# Patient Record
Sex: Female | Born: 1975 | Race: White | Hispanic: No | Marital: Married | Smoking: Never smoker
Health system: Southern US, Community
[De-identification: ages and names within clinical notes are randomized; demographics above are authoritative.]

## PROBLEM LIST (undated history)

## (undated) DIAGNOSIS — E119 Type 2 diabetes mellitus without complications: Secondary | ICD-10-CM

## (undated) HISTORY — PX: CARPAL TUNNEL RELEASE: SHX101

## (undated) HISTORY — PX: COLPOSCOPY: SHX161

## (undated) HISTORY — PX: LYMPH NODE DISSECTION: SHX5087

## (undated) HISTORY — PX: ENDOMETRIAL ABLATION: SHX621

---

## 1997-11-21 ENCOUNTER — Other Ambulatory Visit: Admission: RE | Admit: 1997-11-21 | Discharge: 1997-11-21 | Payer: Self-pay | Admitting: *Deleted

## 1997-12-13 ENCOUNTER — Encounter: Admission: RE | Admit: 1997-12-13 | Discharge: 1998-03-13 | Payer: Self-pay | Admitting: *Deleted

## 1998-01-20 ENCOUNTER — Ambulatory Visit (HOSPITAL_COMMUNITY): Admission: RE | Admit: 1998-01-20 | Discharge: 1998-01-20 | Payer: Self-pay | Admitting: Internal Medicine

## 1998-04-15 ENCOUNTER — Encounter: Admission: RE | Admit: 1998-04-15 | Discharge: 1998-07-14 | Payer: Self-pay | Admitting: *Deleted

## 1998-10-13 ENCOUNTER — Inpatient Hospital Stay (HOSPITAL_COMMUNITY): Admission: AD | Admit: 1998-10-13 | Discharge: 1998-10-13 | Payer: Self-pay | Admitting: Obstetrics and Gynecology

## 1998-10-22 ENCOUNTER — Inpatient Hospital Stay (HOSPITAL_COMMUNITY): Admission: AD | Admit: 1998-10-22 | Discharge: 1998-10-22 | Payer: Self-pay | Admitting: *Deleted

## 1998-10-30 ENCOUNTER — Inpatient Hospital Stay (HOSPITAL_COMMUNITY): Admission: AD | Admit: 1998-10-30 | Discharge: 1998-10-30 | Payer: Self-pay | Admitting: *Deleted

## 1998-11-12 ENCOUNTER — Inpatient Hospital Stay (HOSPITAL_COMMUNITY): Admission: AD | Admit: 1998-11-12 | Discharge: 1998-11-15 | Payer: Self-pay | Admitting: Obstetrics and Gynecology

## 1998-11-18 ENCOUNTER — Encounter (HOSPITAL_COMMUNITY): Admission: RE | Admit: 1998-11-18 | Discharge: 1999-02-16 | Payer: Self-pay | Admitting: Obstetrics and Gynecology

## 1998-12-15 ENCOUNTER — Other Ambulatory Visit: Admission: RE | Admit: 1998-12-15 | Discharge: 1998-12-15 | Payer: Self-pay | Admitting: Obstetrics and Gynecology

## 2000-02-10 ENCOUNTER — Other Ambulatory Visit: Admission: RE | Admit: 2000-02-10 | Discharge: 2000-02-10 | Payer: Self-pay | Admitting: Obstetrics and Gynecology

## 2000-10-21 ENCOUNTER — Encounter (INDEPENDENT_AMBULATORY_CARE_PROVIDER_SITE_OTHER): Payer: Self-pay

## 2000-10-21 ENCOUNTER — Ambulatory Visit (HOSPITAL_COMMUNITY): Admission: RE | Admit: 2000-10-21 | Discharge: 2000-10-21 | Payer: Self-pay | Admitting: Obstetrics and Gynecology

## 2002-03-01 ENCOUNTER — Other Ambulatory Visit: Admission: RE | Admit: 2002-03-01 | Discharge: 2002-03-01 | Payer: Self-pay | Admitting: Obstetrics and Gynecology

## 2003-03-14 ENCOUNTER — Other Ambulatory Visit: Admission: RE | Admit: 2003-03-14 | Discharge: 2003-03-14 | Payer: Self-pay | Admitting: Obstetrics and Gynecology

## 2007-05-22 ENCOUNTER — Emergency Department (HOSPITAL_COMMUNITY): Admission: EM | Admit: 2007-05-22 | Discharge: 2007-05-22 | Payer: Self-pay | Admitting: Family Medicine

## 2010-10-08 ENCOUNTER — Other Ambulatory Visit: Payer: Self-pay | Admitting: Obstetrics and Gynecology

## 2010-10-08 ENCOUNTER — Encounter (HOSPITAL_COMMUNITY): Payer: BC Managed Care – PPO

## 2010-10-08 LAB — CBC
HCT: 41.6 % (ref 36.0–46.0)
MCH: 30.8 pg (ref 26.0–34.0)
MCHC: 34.6 g/dL (ref 30.0–36.0)
MCV: 88.9 fL (ref 78.0–100.0)
Platelets: 180 10*3/uL (ref 150–400)
RDW: 11.7 % (ref 11.5–15.5)
WBC: 6.1 10*3/uL (ref 4.0–10.5)

## 2010-10-08 LAB — BASIC METABOLIC PANEL
BUN: 15 mg/dL (ref 6–23)
Calcium: 9.3 mg/dL (ref 8.4–10.5)
Creatinine, Ser: 0.6 mg/dL (ref 0.4–1.2)
GFR calc non Af Amer: >60 mL/min (ref >60)
Glucose, Bld: 271 mg/dL — ABNORMAL HIGH (ref 70–99)

## 2010-10-16 ENCOUNTER — Other Ambulatory Visit: Payer: Self-pay | Admitting: Obstetrics and Gynecology

## 2010-10-16 ENCOUNTER — Ambulatory Visit (HOSPITAL_COMMUNITY)
Admission: RE | Admit: 2010-10-16 | Discharge: 2010-10-16 | Disposition: A | Discharge status: Home / Self Care | Payer: BC Managed Care – PPO | Source: Ambulatory Visit | Attending: Obstetrics and Gynecology | Admitting: Obstetrics and Gynecology

## 2010-10-16 DIAGNOSIS — Z01812 Encounter for preprocedural laboratory examination: Secondary | ICD-10-CM | POA: Insufficient documentation

## 2010-10-16 DIAGNOSIS — Z01818 Encounter for other preprocedural examination: Secondary | ICD-10-CM | POA: Insufficient documentation

## 2010-10-16 DIAGNOSIS — E119 Type 2 diabetes mellitus without complications: Secondary | ICD-10-CM | POA: Insufficient documentation

## 2010-10-16 DIAGNOSIS — N920 Excessive and frequent menstruation with regular cycle: Secondary | ICD-10-CM | POA: Insufficient documentation

## 2010-10-16 LAB — HCG, SERUM, QUALITATIVE: Preg, Serum: NEGATIVE

## 2010-10-19 NOTE — H&P (Signed)
  NAMELOREN, VICENS            ACCOUNT NO.:  [id number]  MEDICAL RECORD NO.:  [id number]           PATIENT TYPE:  O  LOCATION:  [facility]                          [facility]  [facility]  PHYSICIAN:  Lenoard Aden, M.D.DATE OF BIRTH:  Jun 02, 1976  DATE OF ADMISSION:  10/16/2010 DATE OF DISCHARGE:                             HISTORY & PHYSICAL   CHIEF COMPLAINT:  Menorrhagia, refractory.  HISTORY OF PRESENT ILLNESS:  A 35 year old white female G2, P1, with a history of menorrhagia, who presents for NovaSure endometrial ablation.  ALLERGIES:  She has allergies to LATEX and SUN.  MEDICATIONS:  Insulin.  SOCIAL HISTORY:  She is a nonsmoker, nondrinker.  She denies domestic or physical violence.  CONTRACEPTION:  His husband has a vasectomy.  She has a history of C-section in 2000 and miscarriage x1.  Her obstetric history otherwise noncontributory.  She had D and C for missed AB in 2002.  She had other surgical history include bunionectomy, wisdom tooth removal, and lymph node biopsy.  MEDICAL PROBLEMS:  Include, poorly controlled diabetes mellitus for which she has been seen this week by Endocrinology for preoperative clearance.  FAMILY HISTORY:  Remarkable for breast cancer, hypertension, mental retardation, and diabetes.  PHYSICAL EXAM:  GENERAL:  She is a well-developed, well-nourished white female, in no acute distress. HEENT:  Normal. NECK:  Supple.  Full range of motion. LUNGS:  Clear. HEART:  Regular rhythm. ABDOMEN:  Soft, nontender. PELVIC:  Reveals an anteflexed uterus and no adnexal masses.  IMPRESSION:  Menorrhagia, refractory for surgical therapy.  Plan is to proceed with diagnostic hysteroscopy, D and C, NovaSure.  Risks of anesthesia, infection, bleeding, injury to intraabdominal organs and need for repair was discussed.  Delayed versus immediate complications to include bowel and bladder injury noted.  The patient acknowledges and wishes to  proceed.     Lenoard Aden, M.D.     RJT/MEDQ  D:  10/16/2010  T:  10/16/2010  Job:  [id number]  Electronically Signed by Olivia Mackie M.D. on 10/19/2010 10:20:03 PM

## 2010-10-19 NOTE — Op Note (Signed)
  Sonya Mason, Sonya Mason            ACCOUNT NO.:  [id number]  MEDICAL RECORD NO.:  [id number]           PATIENT TYPE:  O  LOCATION:  [facility]                          [facility]  PHYSICIAN:  Lenoard Aden, M.D.DATE OF BIRTH:  [birth year]  DATE OF PROCEDURE: DATE OF DISCHARGE:                              OPERATIVE REPORT   PREOPERATIVE DIAGNOSIS:  Refractory menorrhagia.  POSTOPERATIVE DIAGNOSIS:  Refractory menorrhagia.  PROCEDURE:  Diagnostic hysteroscopy, D and C, NovaSure, endometrial ablation.  SURGEON:  Lenoard Aden, MD.  ASSISTANT:  None.  ANESTHESIA:  General and local.  ESTIMATED BLOOD LOSS:  Less than 50 mL.  FLUID DEFICIT:  Less than 50 mL.  COMPLICATIONS:  None.  DRAINS:  None.  COUNTS:  Correct.  The patient to recovery in good condition.  BRIEF OPERATIVE NOTE:  After being apprised of risks of anesthesia, infection, bleeding, injury to abdominal organs, need for repair, delayed versus complications to include bowel and bladder injury, possible need for repair, the patient was brought to the operating room, where she was administered general anesthetic without complications. Prepped and draped in sterile fashion.  Catheterized until the bladder was empty.  Achieving adequate anesthesia.  Dilute Marcaine solution placed.  Standard paracervical block was 20 mL total.  Cervix was easily dilated up to #25 [facility] dilator.  Hysteroscope placed.  Visualization reveals a thickened anterior and posterior endometrial wall, but an otherwise normal endometrial cavity with bilateral normal tubal ostia. At this time, endometrial curettings are collected using curettage and a 4 sharp curettage in a four quadrant method.  Good hemostasis was noted. Hysteroscope having been previously removed.  NovaSure device was placed.  Measurements were taken, cavity length of 6.5, cavity width was seated to 4.7.  The device was set.  CO2 test was performed and is negative.   Procedure was initiated for 1 minute and 10 seconds to a power of 168 watts.  Device was then removed at the termination of procedure, inspected and found to be without defects.  Revisualization of the endometrial cavity reveals no evidence of uterine perforation, a well-ablated endometrial cavity except for a small strip of endometrium at the uterine fundus.  At this time, the procedure was terminated.  Good hemostasis was noted.  All instruments were removed.  The patient tolerated the procedure and was transferred to recovery in good condition.     Lenoard Aden, M.D.     RJT/MEDQ  D:  10/16/2010  T:  10/17/2010  Job:  [id number]  Electronically Signed by Olivia Mackie M.D. on 10/19/2010 10:20:06 PM

## 2010-10-23 NOTE — H&P (Signed)
[facility]  Patient:    Sonya Mason, Sonya Mason                          MRN: [id number] Adm. Date:  10/21/00 Attending:  Lenoard Aden, M.D. CC:         [facility]   History and Physical  CHIEF COMPLAINT:              Missed AB.  HISTORY OF PRESENT ILLNESS:   The patient is a 35 year old white female, G1, P1, with known insulin-dependent diabetes and hypertension, who presents with missed AB at 6 weeks, with no fetal heart tones seen on ultrasound, after previously noted fetal bradycardia one week ago.  PAST MEDICAL HISTORY:         1. Chronic hypertension.                               2. Insulin-dependent diabetes.                               3. Hypothyroidism.  MEDICATIONS:                  Insulin.  Prenatal vitamins.  Synthroid.  PAST SURGICAL HISTORY:        1. Lymph node biopsy in 1995.                               2. Wisdom teeth removal in 1997.                               3. Bunionectomy in 2000.                               4. C-section in 2000.  FAMILY HISTORY:               Diabetes.  Hypertension.  Heart disease.  LABORATORY DATA:              Blood type B positive.  PHYSICAL EXAMINATION:  GENERAL:                      Well-developed, well-nourished white female in no apparent distress.  HEENT:                        Normal.  LUNGS:                        Clear.  HEART:                        Regular rhythm.  ABDOMEN:                      Soft, obese, nontender.  PELVIC:                       Small anteflexed uterus, and no adnexal masses.  IMPRESSION:                   1. Missed AB at 6 weeks.  2. Insulin-dependent diabetes, with poor                                  control.                               3. Hypertension, well controlled on no                                  medications.  PLAN:                         Proceed with suction D&E.  The risks of anesthesia, infection,  bleeding, injury to abdominal organs with need for repair was discussed.  Delayed versus immediate complications to include bowel or bladder injury were noted.  Diabetes protocol ordered. DD:  10/21/00 TD:  10/21/00 Job: [id number]

## 2010-10-23 NOTE — Op Note (Signed)
[facility]  Patient:    Sonya Mason, Sonya Mason                        MRN: [id number] Proc. Date: 10/21/00 Adm. Date:  [id number] Disc. Date: [id number] Attending:  Lenoard Aden                           Operative Report  PREOPERATIVE DIAGNOSIS:       Missed abortion at 6-1/2 weeks.  POSTOPERATIVE DIAGNOSIS:      Missed abortion at 6-1/2 weeks.  PROCEDURE:                    Suction dilation and evacuation.  SURGEON:                      Lenoard Aden, M.D.  ANESTHESIA:                   MAC, paracervical.  ANESTHESIOLOGIST:             Gretta Cool., M.D.  COMPLICATIONS:                None.  COUNTS:                       Correct.  SPECIMEN:                     Products of conception to pathology.  ESTIMATED BLOOD LOSS:         50 cc.  DISPOSITION:                  The patient to recovery room in good condition.  DESCRIPTION OF PROCEDURE:     After being apprised of the risks of anesthesia, infection, bleeding, uterine perforation, possible need for repair to bowel, bladder with injury, the patient is brought to the operating room after consents are signed. After achieving adequate IV anesthesia, paracervical block placed with dilute Xylocaine solution, 20 cc, after she is prepped and draped in the usual sterile fashion. At this time, the uterus sounds to 8 cm, it is an anteflexed uterus with no adnexal masses noted on the exam under anesthesia. The uterus is easily dilated up to a #21 [employer] dilator. A 7 mm suction curet placed. Suction obtaining obvious pregnancy related tissue is aspirated using suctioning. Blunt curettage in four-quadrant method reveals the cavity to be empty. Repeat suction curettage confirms. Good hemostasis noted. All instruments are removed under direct visualization. The patient is awakened and transferred to recovery room in good condition. DD:  10/21/00 TD:  10/23/00 Job: [id number]

## 2011-03-31 ENCOUNTER — Emergency Department: Payer: Self-pay | Admitting: Emergency Medicine

## 2012-09-25 ENCOUNTER — Other Ambulatory Visit: Payer: Self-pay | Admitting: Endocrinology

## 2012-09-25 DIAGNOSIS — E049 Nontoxic goiter, unspecified: Secondary | ICD-10-CM

## 2012-10-02 ENCOUNTER — Other Ambulatory Visit: Payer: BC Managed Care – PPO

## 2012-10-18 ENCOUNTER — Encounter: Payer: Self-pay | Admitting: *Deleted

## 2012-10-18 ENCOUNTER — Encounter: Payer: BC Managed Care – PPO | Attending: Endocrinology | Admitting: *Deleted

## 2012-10-18 DIAGNOSIS — Z713 Dietary counseling and surveillance: Secondary | ICD-10-CM | POA: Insufficient documentation

## 2012-10-18 DIAGNOSIS — Z9641 Presence of insulin pump (external) (internal): Secondary | ICD-10-CM | POA: Insufficient documentation

## 2012-10-18 DIAGNOSIS — E109 Type 1 diabetes mellitus without complications: Secondary | ICD-10-CM | POA: Insufficient documentation

## 2012-10-18 NOTE — Patient Instructions (Signed)
Plan:  Aim for 3 Carb Choices per meal (45 grams) +/- 1 either way  Aim for 0-2 Carbs per snack if hungry  Consider reading food labels for Total Carbohydrate of foods Continue with your current activity level daily as tolerated Consider checking BG pre meals each day as tolerated We will ask Dr. Talmage Nap about you wearing the iPro sensor to evaluate pump settings better

## 2012-10-18 NOTE — Progress Notes (Signed)
  Medical Nutrition Therapy:  Appt start time: 1500 end time:  1630.   Assessment:  Primary concerns today: she has had Type 1 diabetes for 18 years and needs further diabetes and pump education as well as better Carb Counting skills. She lives with her husband and 36 yo son. Patient does the grocery shopping, very little food prep at home. [place] with [place], horses, cows and pigs. Works for a Administrator, [employer] in [place]. She is going to school at [employer] studying animal classes so can apply for [employer]. Works outside all day. SMBG once or twice daily, uses Medtronic pump but with limited skills or use of tools that could improve her contol. Her major barrier to her diabetes is the variability of BG ranging from 40 to over 400 mg/dl in a day.  MEDICATIONS: see list, Novolog in insulin pump with average of about 30 units/day.   DIETARY INTAKE: Usual eating pattern includes 2 meals and 3 snacks per day.  Everyday foods include fair variety of all food groups.  Avoided foods include regular soda, coffee, salty foods .    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  Estimated energy needs: 1400 calories 158 g carbohydrates 105 g protein 39 g fat  Progress Towards Goal(s):  In progress.   Nutritional Diagnosis:  NB-1.1 Food and nutrition-related knowledge deficit As related to diabetes control.  As evidenced by A1c of 10.4% in April, 2014.    Intervention:  Nutrition counseling and diabetes education initiated. Discussed basic physiology of diabetes, SMBG and rationale of checking BG at alternate times of day, A1c, Carb  Counting and reading food labels. I uploaded her pump to [facility] and we reviewed the reports together. Spent most of visit teaching Carb Counting via food groups and how to read food labels. Suggest she aim for more consistency of carb intake and once we can stabilize her BGs, we can then move them down gradually. Also suggested use of iPro for up to a week to capture more data of BG patterns so we can adjust her pump settings more accurately. I will call MD office to request referral for her to wear iPRo.  Plan:  Aim for 3 Carb Choices per meal (45 grams) +/- 1 either way  Aim for 0-2 Carbs per snack if hungry  Consider reading food labels for Total Carbohydrate of foods Continue with your current activity level daily as tolerated Consider checking BG pre meals each day as tolerated We will ask Dr. Talmage Nap about you wearing the iPro sensor to evaluate pump settings better   Handouts given during visit include: Living Well with Diabetes Carb Counting and Food Label handouts Meal Plan Card  [facility] Pro Reports printed and reviewed  Monitoring/Evaluation:  Dietary intake, exercise, SMBG before each meal, and body weight in 2 week(s).

## 2012-10-18 NOTE — Progress Notes (Signed)
Also noted that patient is not changing pump out every 3 days but more like every 5-6. Sonya Mason is Suspending the pump for several hours at a time due to physical activity and is not aware of the Temp Basal tool.  Recommended that Sonya Mason change out every 3 days and we will cover the Temp Basal at the next visitl

## 2012-11-01 ENCOUNTER — Encounter: Payer: BC Managed Care – PPO | Admitting: *Deleted

## 2012-11-02 NOTE — Progress Notes (Signed)
iPro Set Up  Time arrived:1500  Time left: 1530  Patient here for placement of iPro2 Continuous Glucose Monitor  Explained to patient purpose of wearing the iPro per MD orders. They expressed verbal understanding.  Sensor inserted into skin at least 2 inches from any insulin injection sites. Patient instructed to check BG at least 4 times each day. Explained to patient how to complete the iPro2 Log Sheet including time of all BG, food intake, exercise and any diabetes medications.  Patient instructed to return on 11/06/2012 for removal of sensor and iPro2 along with the completed Log Sheet  iPro2 Recorder attached to sensor and taped down.  Patient informed that when iPro2 and sensor are connected, the system is waterproof and that they are to wear it consistently until they return to this office.  Patient to call this office if any questions or concerns prior to appointment to return the iPro2 for downloading.

## 2012-11-26 ENCOUNTER — Emergency Department (HOSPITAL_COMMUNITY)
Admission: EM | Admit: 2012-11-26 | Discharge: 2012-11-27 | Disposition: A | Discharge status: Home / Self Care | Payer: BC Managed Care – PPO | Attending: Emergency Medicine | Admitting: Emergency Medicine

## 2012-11-26 ENCOUNTER — Encounter (HOSPITAL_COMMUNITY): Payer: Self-pay | Admitting: *Deleted

## 2012-11-26 DIAGNOSIS — E119 Type 2 diabetes mellitus without complications: Secondary | ICD-10-CM | POA: Insufficient documentation

## 2012-11-26 DIAGNOSIS — Z79899 Other long term (current) drug therapy: Secondary | ICD-10-CM | POA: Insufficient documentation

## 2012-11-26 DIAGNOSIS — Z794 Long term (current) use of insulin: Secondary | ICD-10-CM | POA: Insufficient documentation

## 2012-11-26 DIAGNOSIS — Z3202 Encounter for pregnancy test, result negative: Secondary | ICD-10-CM | POA: Insufficient documentation

## 2012-11-26 DIAGNOSIS — N390 Urinary tract infection, site not specified: Secondary | ICD-10-CM

## 2012-11-26 DIAGNOSIS — Z880 Allergy status to penicillin: Secondary | ICD-10-CM | POA: Insufficient documentation

## 2012-11-26 DIAGNOSIS — R112 Nausea with vomiting, unspecified: Secondary | ICD-10-CM | POA: Insufficient documentation

## 2012-11-26 DIAGNOSIS — R000 Tachycardia, unspecified: Secondary | ICD-10-CM | POA: Insufficient documentation

## 2012-11-26 HISTORY — DX: Type 2 diabetes mellitus without complications: E11.9

## 2012-11-26 LAB — COMPREHENSIVE METABOLIC PANEL
BUN: 15 mg/dL (ref 6–23)
CO2: 24 mEq/L (ref 19–32)
Calcium: 9.2 mg/dL (ref 8.4–10.5)
Creatinine, Ser: 0.65 mg/dL (ref 0.50–1.10)
GFR calc Af Amer: >90 mL/min (ref >90)
GFR calc non Af Amer: >90 mL/min (ref >90)
Glucose, Bld: 259 mg/dL — ABNORMAL HIGH (ref 70–99)

## 2012-11-26 LAB — CBC WITH DIFFERENTIAL/PLATELET
Eosinophils Relative: 0 % (ref 0–5)
HCT: 40.7 % (ref 36.0–46.0)
Lymphocytes Relative: 5 % — ABNORMAL LOW (ref 12–46)
Lymphs Abs: 1 10*3/uL (ref 0.7–4.0)
MCV: 87.2 fL (ref 78.0–100.0)
Monocytes Absolute: 1.5 10*3/uL — ABNORMAL HIGH (ref 0.1–1.0)
Monocytes Relative: 8 % (ref 3–12)
RBC: 4.67 MIL/uL (ref 3.87–5.11)
RDW: 11.6 % (ref 11.5–15.5)
WBC: 18 10*3/uL — ABNORMAL HIGH (ref 4.0–10.5)

## 2012-11-26 LAB — URINALYSIS, ROUTINE W REFLEX MICROSCOPIC
Bilirubin Urine: NEGATIVE
Nitrite: POSITIVE — AB
Protein, ur: 30 mg/dL — AB
Specific Gravity, Urine: 1.031 — ABNORMAL HIGH (ref 1.005–1.030)
Urobilinogen, UA: 0.2 mg/dL (ref 0.0–1.0)

## 2012-11-26 LAB — GLUCOSE, CAPILLARY: Glucose-Capillary: 223 mg/dL — ABNORMAL HIGH (ref 70–99)

## 2012-11-26 LAB — URINE MICROSCOPIC-ADD ON

## 2012-11-26 MED ORDER — ONDANSETRON HCL 4 MG/2ML IJ SOLN
4.0000 mg | Freq: Once | INTRAMUSCULAR | Status: AC
  Administered 2012-11-26: 4 mg via INTRAVENOUS
  Filled 2012-11-26: qty 2

## 2012-11-26 MED ORDER — SULFAMETHOXAZOLE-TMP DS 800-160 MG PO TABS
1.0000 | ORAL_TABLET | Freq: Once | ORAL | Status: AC
  Administered 2012-11-26: 1 via ORAL
  Filled 2012-11-26: qty 1

## 2012-11-26 MED ORDER — SODIUM CHLORIDE 0.9 % IV SOLN: 1000.0000 mL | Freq: Once | INTRAVENOUS | Status: DC

## 2012-11-26 MED ORDER — METOCLOPRAMIDE HCL 5 MG/ML IJ SOLN
10.0000 mg | Freq: Once | INTRAMUSCULAR | Status: AC
  Administered 2012-11-26: 10 mg via INTRAVENOUS
  Filled 2012-11-26: qty 2

## 2012-11-26 MED ORDER — SODIUM CHLORIDE 0.9 % IV BOLUS (SEPSIS)
1000.0000 mL | Freq: Once | INTRAVENOUS | Status: AC
  Administered 2012-11-26: 1000 mL via INTRAVENOUS

## 2012-11-26 MED ORDER — ONDANSETRON HCL 4 MG/2ML IJ SOLN
4.0000 mg | Freq: Once | INTRAMUSCULAR | Status: DC
  Filled 2012-11-26: qty 2

## 2012-11-26 NOTE — ED Notes (Signed)
Pt states that she began having abdominal pain and nausea at 200 today; pt states that co-workers had a "stomach bug" last week; pt states that she has vomited multiple times and is now having dry heaves; pt states that the pain is to upper mid abd and describes it as soreness; pt denies blood to vomit.

## 2012-11-26 NOTE — ED Provider Notes (Signed)
History     CSN: [id number]  Arrival date & time 11/26/12  4696   First MD Initiated Contact with Patient 11/26/12 2053      Chief Complaint  Patient presents with  . Abdominal Pain    (Consider location/radiation/quality/duration/timing/severity/associated sxs/prior treatment) HPI Comments: Patient is an insulin-dependent diabetic, on an insulin pump by Dr. Anthonette Legato this, afternoon.  She started having some nausea and vomiting, and developed epigastric discomfort, denies any diarrhea.  States her blood sugar has been slightly elevated in the low 200s.  Denies any shortness of breath, coughing, chest pain, headache, URI or UTI symptoms  Patient is a 37 y.o. female presenting with abdominal pain. The history is provided by the patient.  Abdominal Pain This is a new problem. The problem occurs constantly. The problem has been unchanged. Associated symptoms include abdominal pain, nausea and vomiting. Pertinent negatives include no weakness. Nothing aggravates the symptoms. She has tried nothing for the symptoms. The treatment provided no relief.    Past Medical History  Diagnosis Date  . Diabetes mellitus without complication     History reviewed. No pertinent past surgical history.  No family history on file.  History  Substance Use Topics  . Smoking status: Never Smoker   . Smokeless tobacco: Never Used  . Alcohol Use: No    OB History   Grav Para Term Preterm Abortions TAB SAB Ect Mult Living                  Review of Systems  Gastrointestinal: Positive for nausea, vomiting and abdominal pain. Negative for diarrhea and constipation.  Genitourinary: Negative for dysuria.  Neurological: Negative for weakness.  All other systems reviewed and are negative.    Allergies  Penicillins  Home Medications   Current Outpatient Rx  Name  Route  Sig  Dispense  Refill  . phentermine 37.5 MG capsule   Oral   Take 37.5 mg by mouth every morning.         . insulin  aspart (NOVOLOG) 100 UNIT/ML injection   Subcutaneous   Inject 30 Units into the skin daily.         . ondansetron (ZOFRAN) 4 MG tablet   Oral   Take 1 tablet (4 mg total) by mouth every 6 (six) hours.   12 tablet   0   . sulfamethoxazole-trimethoprim (BACTRIM DS) 800-160 MG per tablet   Oral   Take 1 tablet by mouth 2 (two) times daily.   5 tablet   0     BP 116/70  Pulse 121  Temp(Src) 99.5 F (37.5 C) (Oral)  Resp 18  Wt 185 lb (83.915 kg)  BMI 29.87 kg/m2  SpO2 100%  Physical Exam  Nursing note and vitals reviewed. Constitutional: She is oriented to person, place, and time. She appears well-developed and well-nourished.  Neck: Normal range of motion.  Cardiovascular: Tachycardia present.   Pulmonary/Chest: Effort normal and breath sounds normal.  Abdominal: Soft. Bowel sounds are normal. She exhibits no distension. There is tenderness.  Musculoskeletal: Normal range of motion.  Neurological: She is alert and oriented to person, place, and time.  Skin: Skin is warm. No erythema.    ED Course  Procedures (including critical care time)  Labs Reviewed  CBC WITH DIFFERENTIAL - Abnormal; Notable for the following:    WBC 18.0 (*)    Neutrophils Relative % 86 (*)    Neutro Abs 15.6 (*)    Lymphocytes Relative 5 (*)  Monocytes Absolute 1.5 (*)    All other components within normal limits  COMPREHENSIVE METABOLIC PANEL - Abnormal; Notable for the following:    Sodium 134 (*)    Glucose, Bld 259 (*)    All other components within normal limits  URINALYSIS, ROUTINE W REFLEX MICROSCOPIC - Abnormal; Notable for the following:    Color, Urine AMBER (*)    APPearance CLOUDY (*)    Specific Gravity, Urine 1.031 (*)    Glucose, UA 500 (*)    Hgb urine dipstick TRACE (*)    Ketones, ur >80 (*)    Protein, ur 30 (*)    Nitrite POSITIVE (*)    Leukocytes, UA SMALL (*)    All other components within normal limits  GLUCOSE, CAPILLARY - Abnormal; Notable for the  following:    Glucose-Capillary 223 (*)    All other components within normal limits  URINE MICROSCOPIC-ADD ON - Abnormal; Notable for the following:    Squamous Epithelial / LPF MANY (*)    Bacteria, UA MANY (*)    All other components within normal limits  URINE CULTURE  POCT PREGNANCY, URINE   No results found.   1. UTI (lower urinary tract infection)       MDM   Patient has UTI.  Will recheck blood sugar.  Patient is able to tolerate fluids        Arman Filter, NP 11/27/12 [phone]

## 2012-11-26 NOTE — ED Notes (Signed)
Pt vomiting x 5 hrs, unable to keep PO down. Pt is diabetic and has insulin pump.

## 2012-11-27 LAB — GLUCOSE, CAPILLARY: Glucose-Capillary: 242 mg/dL — ABNORMAL HIGH (ref 70–99)

## 2012-11-27 MED ORDER — SULFAMETHOXAZOLE-TMP DS 800-160 MG PO TABS: 1.0000 | ORAL_TABLET | Freq: Two times a day (BID) | ORAL | Status: DC

## 2012-11-27 MED ORDER — ONDANSETRON HCL 4 MG PO TABS: 4.0000 mg | ORAL_TABLET | Freq: Four times a day (QID) | ORAL | Status: DC

## 2012-11-27 NOTE — ED Provider Notes (Signed)
Medical screening examination/treatment/procedure(s) were performed by non-physician practitioner and as supervising physician I was immediately available for consultation/collaboration.   Mikesha Migliaccio J. Vale Peraza, MD 11/27/12 1509 

## 2012-11-29 LAB — URINE CULTURE

## 2012-11-30 NOTE — ED Notes (Signed)
Post ED Visit - Positive Culture Follow-up  Culture report reviewed by antimicrobial stewardship pharmacist: []  Wes Dulaney, Pharm.D., BCPS [x]  Celedonio Miyamoto, Pharm.D., BCPS []  Georgina Pillion, Pharm.D., BCPS []  [place], [place].D., BCPS, AAHIVP []  Estella Husk, Pharm.D., BCPS, AAHIVP  Positive urine culture Treated with Sulfa-DS, organism sensitive to the same and no further patient follow-up is required at this time.  Larena Sox 11/30/2012, 2:52 PM

## 2014-02-21 ENCOUNTER — Ambulatory Visit: Payer: Self-pay | Admitting: Podiatry

## 2014-02-28 ENCOUNTER — Ambulatory Visit: Payer: Self-pay | Admitting: Podiatry

## 2014-09-26 ENCOUNTER — Encounter: Payer: Self-pay | Admitting: Podiatry

## 2014-09-26 ENCOUNTER — Ambulatory Visit (INDEPENDENT_AMBULATORY_CARE_PROVIDER_SITE_OTHER): Payer: BLUE CROSS/BLUE SHIELD | Admitting: Podiatry

## 2014-09-26 VITALS — Ht 66.0 in | Wt 190.0 lb

## 2014-09-26 DIAGNOSIS — B351 Tinea unguium: Secondary | ICD-10-CM

## 2014-09-26 NOTE — Patient Instructions (Signed)
Diabetes and Foot Care Diabetes may cause you to have problems because of poor blood supply (circulation) to your feet and legs. This may cause the skin on your feet to become thinner, break easier, and heal more slowly. Your skin may become dry, and the skin may peel and crack. You may also have nerve damage in your legs and feet causing decreased feeling in them. You may not notice minor injuries to your feet that could lead to infections or more serious problems. Taking care of your feet is one of the most important things you can do for yourself.  HOME CARE INSTRUCTIONS  Wear shoes at all times, even in the house. Do not go barefoot. Bare feet are easily injured.  Check your feet daily for blisters, cuts, and redness. If you cannot see the bottom of your feet, use a mirror or ask someone for help.  Wash your feet with warm water (do not use hot water) and mild soap. Then pat your feet and the areas between your toes until they are completely dry. Do not soak your feet as this can dry your skin.  Apply a moisturizing lotion or petroleum jelly (that does not contain alcohol and is unscented) to the skin on your feet and to dry, brittle toenails. Do not apply lotion between your toes.  Trim your toenails straight across. Do not dig under them or around the cuticle. File the edges of your nails with an emery board or nail file.  Do not cut corns or calluses or try to remove them with medicine.  Wear clean socks or stockings every day. Make sure they are not too tight. Do not wear knee-high stockings since they may decrease blood flow to your legs.  Wear shoes that fit properly and have enough cushioning. To break in new shoes, wear them for just a few hours a day. This prevents you from injuring your feet. Always look in your shoes before you put them on to be sure there are no objects inside.  Do not cross your legs. This may decrease the blood flow to your feet.  If you find a minor scrape,  cut, or break in the skin on your feet, keep it and the skin around it clean and dry. These areas may be cleansed with mild soap and water. Do not cleanse the area with peroxide, alcohol, or iodine.  When you remove an adhesive bandage, be sure not to damage the skin around it.  If you have a wound, look at it several times a day to make sure it is healing.  Do not use heating pads or hot water bottles. They may burn your skin. If you have lost feeling in your feet or legs, you may not know it is happening until it is too late.  Make sure your health care provider performs a complete foot exam at least annually or more often if you have foot problems. Report any cuts, sores, or bruises to your health care provider immediately. SEEK MEDICAL CARE IF:   You have an injury that is not healing.  You have cuts or breaks in the skin.  You have an ingrown nail.  You notice redness on your legs or feet.  You feel burning or tingling in your legs or feet.  You have pain or cramps in your legs and feet.  Your legs or feet are numb.  Your feet always feel cold. SEEK IMMEDIATE MEDICAL CARE IF:   There is increasing redness,   swelling, or pain in or around a wound.  There is a red line that goes up your leg.  Pus is coming from a wound.  You develop a fever or as directed by your health care provider.  You notice a bad smell coming from an ulcer or wound. Document Released: 05/21/2000 Document Revised: 01/24/2013 Document Reviewed: 10/31/2012 Exit[employer] Patient Information 2015 [employer]. This information is not intended to replace advice given to you by your health care provider. Make sure you discuss any questions you have with your health care provider.  

## 2014-09-26 NOTE — Progress Notes (Signed)
   Subjective:    Patient ID: Sonya Mason, female    DOB: 1975-08-30, 39 y.o.   MRN: [id number]  HPI 39 year old female presents the office they with complaints of left hallux discoloration and concern for possible nail fungus. She states the nail previously fell off great in one year ago however when it grew back its was discolored and thickened. She is diabetic and states her last blood sugar check today was 260. Her A1c is unknown. Denies any history of ulceration. Denies any numbness or tingling. Denies any claudication symptoms.   Review of Systems  All other systems reviewed and are negative.      Objective:   Physical Exam AAO 3, NAD DP/PT pulses palpable, CRT less than 3 seconds  Protective sensation intact with Simms Weinstein monofilament, vibratory sensation intact, Achilles tendon reflex intact. Left hallux nails hypertrophic, dystrophic, discolored, brittle. There is no tenderness to palpation around the nail mostly swelling erythema or drainage. There is no tenderness  to the other nails. No areas of tenderness to bilateral lower extremities. No overlying edema, erythema, increase in warmth bilaterally. MMT 5/5, ROM WNL No open lesions or pre-ulcerative lesions identified bilaterally    Assessment & Plan:  39 year old female left hallux onychodystrophy, likely onychomycosis -Treatment options were discussed with the patient include alternatives, risks, complications -At this time the nail was biopsied and sent to [facility] labs for evaluation of possible onychomycosis. Discussed various treatment options however we will await the results of the biopsy before proceeding with treatment. Follow-up after nail biopsy results are obtained or sooner if any problems are to arise. In the meantime call any questions or concerns.

## 2014-11-26 ENCOUNTER — Telehealth: Payer: Self-pay | Admitting: *Deleted

## 2014-11-26 NOTE — Telephone Encounter (Signed)
Patient is positive for fungus needs a follow up appointment with dr Jacqualyn Posey for treatment. Called and left voicemail on mobile phone

## 2014-11-28 ENCOUNTER — Telehealth: Payer: Self-pay | Admitting: Podiatry

## 2014-11-28 NOTE — Telephone Encounter (Signed)
I called the patient at her request to discuss fungal culture results. No answer. Left voicemail to call the office to discuss.

## 2014-11-29 ENCOUNTER — Telehealth: Payer: Self-pay | Admitting: *Deleted

## 2014-11-29 MED ORDER — TAVABOROLE 5 % EX SOLN: 1.0000 [drp] | Freq: Every day | CUTANEOUS | Status: DC

## 2014-11-29 NOTE — Telephone Encounter (Signed)
Entered in error

## 2014-11-29 NOTE — Telephone Encounter (Addendum)
Informed pt Dr. Jacqualyn Posey reviewed fungal culture results as positive and order Lamisil or topical therapy.  Pt states she would prefer topical, but if not covered by insurance would use the Lamisil.  Dr. Jacqualyn Posey ordered [place] Mon, and pt is informed the rx will be coming from [facility].

## 2015-01-06 ENCOUNTER — Encounter: Payer: Self-pay | Admitting: Podiatry

## 2015-01-06 ENCOUNTER — Ambulatory Visit (INDEPENDENT_AMBULATORY_CARE_PROVIDER_SITE_OTHER): Payer: BLUE CROSS/BLUE SHIELD

## 2015-01-06 ENCOUNTER — Ambulatory Visit (INDEPENDENT_AMBULATORY_CARE_PROVIDER_SITE_OTHER): Payer: BLUE CROSS/BLUE SHIELD | Admitting: Podiatry

## 2015-01-06 VITALS — BP 157/96 | HR 96 | Resp 12

## 2015-01-06 DIAGNOSIS — M722 Plantar fascial fibromatosis: Secondary | ICD-10-CM | POA: Diagnosis not present

## 2015-01-06 MED ORDER — TRIAMCINOLONE ACETONIDE 10 MG/ML IJ SUSP
10.0000 mg | Freq: Once | INTRAMUSCULAR | Status: AC
  Administered 2015-01-06: 10 mg

## 2015-01-06 MED ORDER — MELOXICAM 7.5 MG PO TABS: 7.5000 mg | ORAL_TABLET | Freq: Every day | ORAL | Status: DC

## 2015-01-06 NOTE — Patient Instructions (Signed)
Plantar Fasciitis (Heel Spur Syndrome) with Rehab The plantar fascia is a fibrous, ligament-like, soft-tissue structure that spans the bottom of the foot. Plantar fasciitis is a condition that causes pain in the foot due to inflammation of the tissue. SYMPTOMS   Pain and tenderness on the underneath side of the foot.  Pain that worsens with standing or walking. CAUSES  Plantar fasciitis is caused by irritation and injury to the plantar fascia on the underneath side of the foot. Common mechanisms of injury include:  Direct trauma to bottom of the foot.  Damage to a small nerve that runs under the foot where the main fascia attaches to the heel bone.  Stress placed on the plantar fascia due to bone spurs. RISK INCREASES WITH:   Activities that place stress on the plantar fascia (running, jumping, pivoting, or cutting).  Poor strength and flexibility.  Improperly fitted shoes.  Tight calf muscles.  Flat feet.  Failure to warm-up properly before activity.  Obesity. PREVENTION  Warm up and stretch properly before activity.  Allow for adequate recovery between workouts.  Maintain physical fitness:  Strength, flexibility, and endurance.  Cardiovascular fitness.  Maintain a health body weight.  Avoid stress on the plantar fascia.  Wear properly fitted shoes, including arch supports for individuals who have flat feet. PROGNOSIS  If treated properly, then the symptoms of plantar fasciitis usually resolve without surgery. However, occasionally surgery is necessary. RELATED COMPLICATIONS   Recurrent symptoms that may result in a chronic condition.  Problems of the lower back that are caused by compensating for the injury, such as limping.  Pain or weakness of the foot during push-off following surgery.  Chronic inflammation, scarring, and partial or complete fascia tear, occurring more often from repeated injections. TREATMENT  Treatment initially involves the use of  ice and medication to help reduce pain and inflammation. The use of strengthening and stretching exercises may help reduce pain with activity, especially stretches of the Achilles tendon. These exercises may be performed at home or with a therapist. Your caregiver may recommend that you use heel cups of arch supports to help reduce stress on the plantar fascia. Occasionally, corticosteroid injections are given to reduce inflammation. If symptoms persist for greater than 6 months despite non-surgical (conservative), then surgery may be recommended.  MEDICATION   If pain medication is necessary, then nonsteroidal anti-inflammatory medications, such as aspirin and ibuprofen, or other minor pain relievers, such as acetaminophen, are often recommended.  Do not take pain medication within 7 days before surgery.  Prescription pain relievers may be given if deemed necessary by your caregiver. Use only as directed and only as much as you need.  Corticosteroid injections may be given by your caregiver. These injections should be reserved for the most serious cases, because they may only be given a certain number of times. HEAT AND COLD  Cold treatment (icing) relieves pain and reduces inflammation. Cold treatment should be applied for 10 to 15 minutes every 2 to 3 hours for inflammation and pain and immediately after any activity that aggravates your symptoms. Use ice packs or massage the area with a piece of ice (ice massage).  Heat treatment may be used prior to performing the stretching and strengthening activities prescribed by your caregiver, physical therapist, or athletic trainer. Use a heat pack or soak the injury in warm water. SEEK IMMEDIATE MEDICAL CARE IF:  Treatment seems to offer no benefit, or the condition worsens.  Any medications produce adverse side effects. EXERCISES RANGE   OF MOTION (ROM) AND STRETCHING EXERCISES - Plantar Fasciitis (Heel Spur Syndrome) These exercises may help you  when beginning to rehabilitate your injury. Your symptoms may resolve with or without further involvement from your physician, physical therapist or athletic trainer. While completing these exercises, remember:   Restoring tissue flexibility helps normal motion to return to the joints. This allows healthier, less painful movement and activity.  An effective stretch should be held for at least 30 seconds.  A stretch should never be painful. You should only feel a gentle lengthening or release in the stretched tissue. RANGE OF MOTION - Toe Extension, Flexion  Sit with your right / left leg crossed over your opposite knee.  Grasp your toes and gently pull them back toward the top of your foot. You should feel a stretch on the bottom of your toes and/or foot.  Hold this stretch for __________ seconds.  Now, gently pull your toes toward the bottom of your foot. You should feel a stretch on the top of your toes and or foot.  Hold this stretch for __________ seconds. Repeat __________ times. Complete this stretch __________ times per day.  RANGE OF MOTION - Ankle Dorsiflexion, Active Assisted  Remove shoes and sit on a chair that is preferably not on a carpeted surface.  Place right / left foot under knee. Extend your opposite leg for support.  Keeping your heel down, slide your right / left foot back toward the chair until you feel a stretch at your ankle or calf. If you do not feel a stretch, slide your bottom forward to the edge of the chair, while still keeping your heel down.  Hold this stretch for __________ seconds. Repeat __________ times. Complete this stretch __________ times per day.  STRETCH - Gastroc, Standing  Place hands on wall.  Extend right / left leg, keeping the front knee somewhat bent.  Slightly point your toes inward on your back foot.  Keeping your right / left heel on the floor and your knee straight, shift your weight toward the wall, not allowing your back to  arch.  You should feel a gentle stretch in the right / left calf. Hold this position for __________ seconds. Repeat __________ times. Complete this stretch __________ times per day. STRETCH - Soleus, Standing  Place hands on wall.  Extend right / left leg, keeping the other knee somewhat bent.  Slightly point your toes inward on your back foot.  Keep your right / left heel on the floor, bend your back knee, and slightly shift your weight over the back leg so that you feel a gentle stretch deep in your back calf.  Hold this position for __________ seconds. Repeat __________ times. Complete this stretch __________ times per day. STRETCH - Gastrocsoleus, Standing  Note: This exercise can place a lot of stress on your foot and ankle. Please complete this exercise only if specifically instructed by your caregiver.   Place the ball of your right / left foot on a step, keeping your other foot firmly on the same step.  Hold on to the wall or a rail for balance.  Slowly lift your other foot, allowing your body weight to press your heel down over the edge of the step.  You should feel a stretch in your right / left calf.  Hold this position for __________ seconds.  Repeat this exercise with a slight bend in your right / left knee. Repeat __________ times. Complete this stretch __________ times per day.    STRENGTHENING EXERCISES - Plantar Fasciitis (Heel Spur Syndrome)  These exercises may help you when beginning to rehabilitate your injury. They may resolve your symptoms with or without further involvement from your physician, physical therapist or athletic trainer. While completing these exercises, remember:   Muscles can gain both the endurance and the strength needed for everyday activities through controlled exercises.  Complete these exercises as instructed by your physician, physical therapist or athletic trainer. Progress the resistance and repetitions only as guided. STRENGTH -  Towel Curls  Sit in a chair positioned on a non-carpeted surface.  Place your foot on a towel, keeping your heel on the floor.  Pull the towel toward your heel by only curling your toes. Keep your heel on the floor.  If instructed by your physician, physical therapist or athletic trainer, add ____________________ at the end of the towel. Repeat __________ times. Complete this exercise __________ times per day. STRENGTH - Ankle Inversion  Secure one end of a rubber exercise band/tubing to a fixed object (table, pole). Loop the other end around your foot just before your toes.  Place your fists between your knees. This will focus your strengthening at your ankle.  Slowly, pull your big toe up and in, making sure the band/tubing is positioned to resist the entire motion.  Hold this position for __________ seconds.  Have your muscles resist the band/tubing as it slowly pulls your foot back to the starting position. Repeat __________ times. Complete this exercises __________ times per day.  Document Released: 05/24/2005 Document Revised: 08/16/2011 Document Reviewed: 09/05/2008 [employer] Patient Information 2015 [employer]. This information is not intended to replace advice given to you by your health care provider. Make sure you discuss any questions you have with your health care provider.  

## 2015-01-06 NOTE — Progress Notes (Signed)
   Subjective:    Patient ID: Sonya Mason, female    DOB: 10-30-1975, 40 y.o.   MRN: [id number]  HPI  39 year old female presents the office today with concerns of right heel pain which started last Wednesday. She states that she has pain in the morning or after periods of rest which is relieved by activity. She denies any history of injury or trauma. Denies any shoe changes. She denies any numbness or tingling and denies any swelling or redness. The pain does not wake her up at night. She's had no prior treatment. No other complaints at this time. She is continuing with Kerydin for nail fungus without any problems.   Review of Systems  Musculoskeletal: Positive for gait problem.  All other systems reviewed and are negative.      Objective:   Physical Exam AAO x3, NAD DP/PT pulses palpable bilaterally, CRT less than 3 seconds Protective sensation intact with Simms Weinstein monofilament, vibratory sensation intact, Achilles tendon reflex intact Tenderness to palpation overlying the plantar medial tubercle of the calcaneus to right heel at the insertion of the plantar fascia. There is no pain along the course of plantar fascial within the arch of the foot. There is no pain with lateral compression of the calcaneus or pain the vibratory sensation. No pain on the posterior aspect of the calcaneus or along the course/insertion of the Achilles tendon. There is no overlying edema, erythema, increase in warmth. No other areas of tenderness palpation or pain with vibratory sensation to the foot/ankle. MMT 5/5, ROM WNL No open lesions or pre-ulcerative lesions are identified. No pain with calf compression, swelling, warmth, erythema.     Assessment & Plan:  39 year old female right heel pain, likely plantar fasciitis -X-rays were obtained and reviewed with the patient.  -Treatment options discussed including all alternatives, risks, and complications Patient elects to proceed with steroid  injection into the right heel. Under sterile skin preparation, a total of 2.5cc of kenalog 10, 0.5% Marcaine plain, and 2% lidocaine plain were infiltrated into the symptomatic area without complication. A band-aid was applied. Patient tolerated the injection well without complication. Post-injection care with discussed with the patient. Discussed with the patient to ice the area over the next couple of days to help prevent a steroid flare.  -Dispensed plantar fascial brace -Continue ice and stretching activities on a daily basis consistently. -Prescribed mobic. Discussed side effects of the medication and directed to stop if any are to occur and call the office.  -Discussed shoe gear modifications and orthotics. -Follow-up 3 weeks or sooner if any problems arise. In the meantime, encouraged to call the office with any questions, concerns, change in symptoms.   Celesta Gentile, DPM

## 2015-01-21 ENCOUNTER — Ambulatory Visit (INDEPENDENT_AMBULATORY_CARE_PROVIDER_SITE_OTHER): Payer: BLUE CROSS/BLUE SHIELD | Admitting: Podiatry

## 2015-01-21 ENCOUNTER — Encounter: Payer: Self-pay | Admitting: Podiatry

## 2015-01-21 VITALS — BP 153/94 | HR 82 | Resp 18

## 2015-01-21 DIAGNOSIS — M722 Plantar fascial fibromatosis: Secondary | ICD-10-CM | POA: Diagnosis not present

## 2015-01-21 MED ORDER — MELOXICAM 7.5 MG PO TABS: 7.5000 mg | ORAL_TABLET | Freq: Every day | ORAL | Status: DC

## 2015-01-22 NOTE — Progress Notes (Signed)
Patient ID: Sonya Mason, female   DOB: Feb 08, 1976, 39 y.o.   MRN: [id number]  Subjective: 39 year old female presents the office today for follow-up evaluation of right heel pain and plantar fasciitis. She states that she had some relief after the injection for approximate 5 days for the pain started to recur. She does stretch and ice intermittently although not on a consecutive basis. She also states that she was wearing the plantar fascial brace which helps some. She did not at the meloxicam prescription. She continues to wear sandals. She states that she became frustrated but she still has pain and would like to treat this before he gets to much worse as multiple Mel Almond #recently undergone surgery for the same problem. No other complaints at this time. No acute changes.  Objective: AAO x3, NAD DP/PT pulses palpable bilaterally, CRT less than 3 seconds Protective sensation intact with Simms Weinstein monofilament, vibratory sensation intact, Achilles tendon reflex intact There is continued tenderness to palpation overlying the plantar medial tubercle of the calcaneus to the right heel at the insertion of the plantar fascia. There is no pain along the course of plantar fascia within the arch of the foot. There is no pain with lateral compression of the calcaneus or pain the vibratory sensation. No pain on the posterior aspect of the calcaneus or along the course/insertion of the Achilles tendon. There is no overlying edema, erythema, increase in warmth. No other areas of tenderness palpation or pain with vibratory sensation to the foot/ankle. MMT 5/5, ROM WNL. Equinus present. Upon dorsiflexion of the ankle the Achilles tendon feels tight and she does have some discomfort to the area. There is no defect noted. No open lesions or pre-ulcerative lesions are identified. No pain with calf compression, swelling, warmth, erythema.  Assessment: 39 year old female continued right heel pain, plantar  fasciitis  Plan: -Treatment options discussed including all alternatives, risks, and complications -She wishes to hold off on another steroid injection. -I reordered meloxicam as she states that she did not get the prescription last time. -Continue stretching icing activities daily. -I do believe that she would likely benefit from orthotics. Discussed with custom and over-the-counter. Shoulder purchasing over-the-counter. -Dispensed night splint. -She gear changes. Discussed with her to wear more supportive shoe at all times. Recommended not to go barefoot unit while at home. -Follow-up 3-4 weeks or sooner if any problems arise. In the meantime, encouraged to call the office with any questions, concerns, change in symptoms.   Celesta Gentile, DPM

## 2015-02-11 ENCOUNTER — Telehealth: Payer: Self-pay | Admitting: *Deleted

## 2015-02-11 NOTE — Telephone Encounter (Signed)
Called patient on 02/04/15 and left a message to call us back and that her insurance denied the kerydin and per Dr Jacqualyn Posey patient could try lamisil for 30 days and get bloodwork done. Patient has not called back today but does have an appt on 02-20-15. Lattie Haw

## 2015-02-18 ENCOUNTER — Ambulatory Visit: Payer: BLUE CROSS/BLUE SHIELD | Admitting: Podiatry

## 2015-02-20 ENCOUNTER — Ambulatory Visit: Payer: BLUE CROSS/BLUE SHIELD | Admitting: Podiatry

## 2015-03-13 ENCOUNTER — Encounter: Payer: Self-pay | Admitting: Podiatry

## 2015-03-13 ENCOUNTER — Ambulatory Visit (INDEPENDENT_AMBULATORY_CARE_PROVIDER_SITE_OTHER): Payer: BLUE CROSS/BLUE SHIELD | Admitting: Podiatry

## 2015-03-13 VITALS — BP 116/77 | HR 86 | Resp 18

## 2015-03-13 DIAGNOSIS — M779 Enthesopathy, unspecified: Secondary | ICD-10-CM | POA: Diagnosis not present

## 2015-03-13 NOTE — Patient Instructions (Signed)
Peroneal Tendinitis With Rehab Tendonitis is inflammation of a tendon. Inflammation of the tendons on the back of the outer ankle (peroneal tendons) is known as peroneal tendonitis. The peroneal tendons are responsible for connecting the muscles that allow you to stand on your tiptoes to the bones of the ankle. For this reason, peroneal tendonitis often causes pain when trying to complete such motions. Peroneal tendonitis often involves a tear (strain) of the peroneal tendons. Strains are classified into three categories. Grade 1 strains cause pain, but the tendon is not lengthened. Grade 2 strains include a lengthened ligament, due to the ligament being stretched or partially ruptured. With grade 2 strains there is still function, although function may be decreased. Grade 3 strains involve a complete tear of the tendon or muscle, and function is usually impaired. SYMPTOMS   Pain, tenderness, swelling, warmth, or redness over the back of the outer side of the ankle, the outer part of the mid-foot, or the bottom of the arch.  Pain that gets worse with ankle motion (especially when pushing off or pushing down with the front of the foot), or when standing on the ball of the foot or pushing the foot outward.  Crackling sound (crepitation) when the tendon is moved or touched. CAUSES  Peroneal tendinitis occurs when injury to the peroneal tendons causes the body to respond with inflammation. Common causes of injury include:  An overuse injury, in which the groove behind the outer ankle (where the tendon is located) causes wear on the tendon.  A sudden stress placed on the tendon, such as from an increase in the intensity, frequency, or duration of training.  Direct hit (trauma) to the tendon.  Return to activity too soon after a previous ankle injury. RISK INCREASES WITH:  Sports that require sudden, repetitive pushing off of the foot, such as jumping or quick starts.  Kicking and running sports,  especially running down hills or long distances.  Poor strength and flexibility.  Previous injury to the foot, ankle, or leg. PREVENTION  Warm up and stretch properly before activity.  Allow for adequate recovery between workouts.  Maintain physical fitness:  Strength, flexibility, and endurance.  Cardiovascular fitness.  Complete rehabilitation after previous injury. PROGNOSIS  If treated properly, peroneal tendonitis usually heals within 6 weeks.  RELATED COMPLICATIONS  Longer healing time, if not properly treated or if not given enough time to heal.  Recurring symptoms if activity is resumed too soon, with overuse, or when using poor technique.  If untreated, tendinitis may result in tendon rupture, requiring surgery. TREATMENT  Treatment first involves the use of ice and medicine to reduce pain and inflammation. The use of strengthening and stretching exercises may help reduce pain with activity. These exercises may be performed at unsuccessful, surgery to remove the inflamed tendon lining (sheath) may be advised.  MEDICATION   If pain medicine is needed, nonsteroidal anti-inflammatory medicines (aspirin and ibuprofen), or other minor pain relievers (acetaminophen), are often advised.  Do not take pain medicine for 7 days before surgery.  Prescription pain relievers may be given, if your caregiver thinks they are needed. Use only as directed and only as much as you need. HEAT AND COLD  Cold treatment (icing) should be applied for 10 to 15 minutes every 2 to 3 hours for inflammation and pain, and immediately after activity that aggravates your symptoms. Use ice packs or an ice massage.  Heat treatment may be used before performing stretching and strengthening activities prescribed by your   caregiver, physical therapist, or athletic trainer. Use a heat pack or a warm water soak. SEEK MEDICAL CARE IF:  Symptoms get worse or do not improve in 2 to 4 weeks, despite  treatment.  New, unexplained symptoms develop. (Drugs used in treatment may produce side effects.) EXERCISES RANGE OF MOTION (ROM) AND STRETCHING EXERCISES - Peroneal Tendinitis These exercises may help you when beginning to rehabilitate your injury. Your symptoms may resolve with or without further involvement from your physician, physical therapist or athletic trainer. While completing these exercises, remember:   Restoring tissue flexibility helps normal motion to return to the joints. This allows healthier, less painful movement and activity.  An effective stretch should be held for at least 30 seconds.  A stretch should never be painful. You should only feel a gentle lengthening or release in the stretched tissue. RANGE OF MOTION - Ankle Eversion  Sit with your right / left ankle crossed over your opposite knee.  Grip your foot with your opposite hand, placing your thumb on the top of your foot and your fingers across the bottom of your foot.  Gently push your foot downward with a slight rotation, so your littlest toes rise slightly toward the ceiling.  You should feel a gentle stretch on the inside of your ankle. Hold the stretch for __________ seconds. Repeat __________ times. Complete this exercise __________ times per day.  RANGE OF MOTION - Ankle Inversion  Sit with your right / left ankle crossed over your opposite knee.  Grip your foot with your opposite hand, placing your thumb on the bottom of your foot and your fingers across the top of your foot.  Gently pull your foot so the smallest toe comes toward you and your thumb pushes the inside of the ball of your foot away from you.  You should feel a gentle stretch on the outside of your ankle. Hold the stretch for __________ seconds. Repeat __________ times. Complete this exercise __________ times per day.  RANGE OF MOTION - Ankle Plantar Flexion  Sit with your right / left leg crossed over your opposite knee.  Use  your opposite hand to pull the top of your foot and toes toward you.  You should feel a gentle stretch on the top of your foot and ankle. Hold this position for __________ seconds. Repeat __________ times. Complete __________ times per day.  STRETCH - Gastroc, Standing  Place your hands on a wall.  Extend your right / left leg behind you, keeping the front knee somewhat bent.  Slightly point your toes inward on your back foot.  Keeping your right / left heel on the floor and your knee straight, shift your weight toward the wall, not allowing your back to arch.  You should feel a gentle stretch in the calf. Hold this position for __________ seconds. Repeat __________ times. Complete this stretch __________ times per day. STRETCH - Soleus, Standing  Place your hands on a wall.  Extend your right / left leg behind you, keeping the other knee somewhat bent.  Slightly point your toes inward on your back foot.  Keep your heel on the floor, bend your back knee, and slightly shift your weight over the back leg so that you feel a gentle stretch deep in your back calf.  Hold this position for __________ seconds. Repeat __________ times. Complete this stretch __________ times per day. STRETCH - Gastrocsoleus, Standing Note: This exercise can place a lot of stress on your foot and ankle. Please   complete this exercise only if specifically instructed by your caregiver.   Place the ball of your right / left foot on a step, keeping your other foot firmly on the same step.  Hold on to the wall or a rail for balance.  Slowly lift your other foot, allowing your body weight to press your heel down over the edge of the step.  You should feel a stretch in your right / left calf.  Hold this position for __________ seconds.  Repeat this exercise with a slight bend in your knee. Repeat __________ times. Complete this stretch __________ times per day.  STRENGTHENING EXERCISES - Peroneal Tendinitis   These exercises may help you when beginning to rehabilitate your injury. They may resolve your symptoms with or without further involvement from your physician, physical therapist or athletic trainer. While completing these exercises, remember:   Muscles can gain both the endurance and the strength needed for everyday activities through controlled exercises.  Complete these exercises as instructed by your physician, physical therapist or athletic trainer. Increase the resistance and repetitions only as guided by your caregiver. STRENGTH - Dorsiflexors  Secure a rubber exercise band or tubing to a fixed object (table, pole) and loop the other end around your right / left foot.  Sit on the floor facing the fixed object. The band should be slightly tense when your foot is relaxed.  Slowly draw your foot back toward you, using your ankle and toes.  Hold this position for __________ seconds. Slowly release the tension in the band and return your foot to the starting position. Repeat __________ times. Complete this exercise __________ times per day.  STRENGTH - Towel Curls  Sit in a chair, on a non-carpeted surface.  Place your foot on a towel, keeping your heel on the floor.  Pull the towel toward your heel only by curling your toes. Keep your heel on the floor.  If instructed by your physician, physical therapist or athletic trainer, add weight to the end of the towel. Repeat __________ times. Complete this exercise __________ times per day. STRENGTH - Ankle Eversion   Secure one end of a rubber exercise band or tubing to a fixed object (table, pole). Loop the other end around your foot, just before your toes.  Place your fists between your knees. This will focus your strengthening at your ankle.  Drawing the band across your opposite foot, away from the pole, slowly, pull your little toe out and up. Make sure the band is positioned to resist the entire motion.  Hold this position for  __________ seconds.  Have your muscles resist the band, as it slowly pulls your foot back to the starting position. Repeat __________ times. Complete this exercise __________ times per day.    This information is not intended to replace advice given to you by your health care provider. Make sure you discuss any questions you have with your health care provider.   Document Released: 05/24/2005 Document Revised: 10/08/2014 Document Reviewed: 09/05/2008 Elsevier Interactive Patient Education 2016 [employer]  

## 2015-03-17 NOTE — Progress Notes (Signed)
Patient ID: Sonya Mason, female   DOB: 04/28/1976, 39 y.o.   MRN: [id number]  Subjective: 39 year old female presents the office today for follow-up evaluation of right heel pain. She states that the pain has moved this morning outside part of her ankle at this time. She says the pain to her heels actually resolved most of her pain is the outside part of her ankle. She cancer last appointment she is doing well however since then the pain started to recur. She denies any change or increase in activities she denies any recent injury or trauma. She denies any swelling or redness. No tingling or numbness. The pain does not wake her up at night. No other complaints at this time. Denies any systemic complaints as fevers, chills, nausea, vomiting. No calf pain, chest pain, shortness of breath.  Objective: AAO 3, NAD DP/PT pulses palpable, CRT less than 3 seconds Protective sensation intact with Simms Weinstein monofilament There is tenderness palpation along the right lateral ankle just inferior to lateral malleolus and just proximal to the fifth metatarsal base on the course of the peroneal tendon. There is no pain with eversion/inversion. No pain with range of motion of the peroneal tendons. There nail tendons appear to be intact. There is no specific area pinpoint bony tenderness or pain vibratory sensation. There is no pain on the plantar medial tubercle of the calcaneus or along the course of the plantar fascia/insertion. She does appear to walk on the lateral aspect of her foot during ambulation and rolls outward. There is no overlying edema, erythema, increase in warmth. There is no pain with calf compression, swelling, warmth, erythema.  Assessment: 39 year old female with peroneal tendinitis with resolving right heel pain  Plan: -Treatment options discussed including all alternatives, risks, and complications -Etiology of symptoms were discussed -Dispesned trilock brace  -She does not take  his inflammatory sign discussed full tear and gel. She states that she has similar medicine at home already that she went to try. -Range of motion/rehabilitation exercises. -Supportive shoe gear. Discussed orthotics. -Follow-up in 3-4 weeks if symptoms are not resolved or sooner if any problems arise. In the meantime, encouraged to call the office with any questions, concerns, change in symptoms.   Celesta Gentile, DPM

## 2015-04-10 ENCOUNTER — Ambulatory Visit: Payer: BLUE CROSS/BLUE SHIELD | Admitting: Podiatry

## 2015-05-07 ENCOUNTER — Inpatient Hospital Stay: Payer: BLUE CROSS/BLUE SHIELD

## 2015-05-14 ENCOUNTER — Encounter: Payer: Self-pay | Admitting: Internal Medicine

## 2015-05-14 ENCOUNTER — Inpatient Hospital Stay: Payer: BLUE CROSS/BLUE SHIELD | Attending: Internal Medicine | Admitting: Internal Medicine

## 2015-05-14 VITALS — BP 132/87 | HR 88 | Temp 97.6°F | Resp 18 | Ht 66.0 in | Wt 202.4 lb

## 2015-05-14 DIAGNOSIS — D6851 Activated protein C resistance: Secondary | ICD-10-CM | POA: Insufficient documentation

## 2015-05-14 DIAGNOSIS — E119 Type 2 diabetes mellitus without complications: Secondary | ICD-10-CM | POA: Insufficient documentation

## 2015-05-14 DIAGNOSIS — Z79899 Other long term (current) drug therapy: Secondary | ICD-10-CM | POA: Diagnosis not present

## 2015-05-14 DIAGNOSIS — Z794 Long term (current) use of insulin: Secondary | ICD-10-CM

## 2015-05-14 NOTE — Progress Notes (Signed)
Pt here as new pt with dx of factor v leiden mutation.  She has had carpal tunnel surgery, and as a teenager she had a lymph node that is the size of golf ball and they removed it and it was not cancer. She had not had any blood clots

## 2015-05-14 NOTE — Progress Notes (Signed)
[place] @ [facility] Telephone:(336) [phone]  Fax:([phone]) [place]: 1975/10/25  MR#: [id number]  YBO#:[id number]  Patient Care Team: Jacelyn Pi, MD as PCP - General (Endocrinology)  CHIEF COMPLAINT: No chief complaint on file.    No history exists.    Oncology Flowsheet 11/26/2012  metoCLOPramide (REGLAN) IV 10 mg  ondansetron (ZOFRAN) IV 4 mg    HISTORY OF PRESENT ILLNESS:   Mrs. 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 Her father develops pulmonary embolism one week after having a knee surgery, and subsequently he was tested positive for factor V Leiden mutation. Unfortunately, Mrs. Sonya Mason does not know whether he is heterozygote or homozygous for this mutation. As a follow-up to this development she was tested as well, and turned out to have 1 copy of mutated gene. She has no complains, her major medical problem is diabetes, for which she is receiving insulin via pump. She denies any history of blood clots, lower extremity swelling or pain, shortness of breath or cough. She was pregnant twice and did not have any blood clots during either of the pregnancies. She has never taken any oral contraceptive pills.   REVIEW OF SYSTEMS:   Review of Systems  All other systems reviewed and are negative.    PAST MEDICAL HISTORY: Past Medical History  Diagnosis Date  . Diabetes mellitus without complication ([place])     PAST SURGICAL HISTORY: Past Surgical History  Procedure Laterality Date  . Carpal tunnel release    . Lymph node dissection Right     no cancer. right neck    FAMILY HISTORY Family History  Problem Relation Age of Onset  . Hypertension Mother   . Factor V Leiden deficiency Father   . Deep vein thrombosis Father   . Bone cancer Paternal Aunt   . Breast cancer Paternal Grandmother   . Hypertension Paternal Grandfather   . Skin cancer  Paternal Aunt   . Thyroid disease Paternal Aunt     ADVANCED DIRECTIVES:  No flowsheet data found.  HEALTH MAINTENANCE: Social History  Substance Use Topics  . Smoking status: Never Smoker   . Smokeless tobacco: Never Used  . Alcohol Use: No     Allergies  Allergen Reactions  . Penicillins Rash    Current Outpatient Prescriptions  Medication Sig Dispense Refill  . insulin aspart (NOVOLOG) 100 UNIT/ML injection Inject 30 Units into the skin daily.    . meloxicam (MOBIC) 7.5 MG tablet Take 1 tablet (7.5 mg total) by mouth daily. 30 tablet 2  . Tavaborole (KERYDIN) 5 % SOLN Apply 1 drop topically daily. 1 Bottle 11   No current facility-administered medications for this visit.    OBJECTIVE:  Filed Vitals:   05/14/15 1107  BP: 132/87  Pulse: 88  Temp: 97.6 F (36.4 C)  Resp: 18     Body mass index is 32.68 kg/(m^2).    ECOG FS:0 - Asymptomatic  Physical Exam  Constitutional: She is well-developed, well-nourished, and in no distress.  Caucasian female  Eyes: Conjunctivae and EOM are normal. Pupils are equal, round, and reactive to light.  Neck: Normal range of motion. Neck supple. No tracheal deviation present.  Pulmonary/Chest: No stridor.  Musculoskeletal: Normal range of motion.  Skin: Skin is warm and dry. No rash noted. No erythema.     LAB RESULTS:  CBC Latest Ref Rng 11/26/2012 10/08/2010  WBC 4.0 -  10.5 K/uL 18.0(H) 6.1  Hemoglobin 12.0 - 15.0 g/dL 13.9 14.4  Hematocrit 36.0 - 46.0 % 40.7 41.6  Platelets 150 - 400 K/uL 204 180    No visits with results within 5 Day(s) from this visit. Latest known visit with results is:  Admission on 11/26/2012, Discharged on 11/27/2012  Component Date Value Ref Range Status  . WBC 11/26/2012 18.0* 4.0 - 10.5 K/uL Final  . RBC 11/26/2012 4.67  3.87 - 5.11 MIL/uL Final  . Hemoglobin 11/26/2012 13.9  12.0 - 15.0 g/dL Final  . HCT 11/26/2012 40.7  36.0 - 46.0 % Final  . MCV 11/26/2012 87.2  78.0 - 100.0 fL Final  .  MCH 11/26/2012 29.8  26.0 - 34.0 pg Final  . MCHC 11/26/2012 34.2  30.0 - 36.0 g/dL Final  . RDW 11/26/2012 11.6  11.5 - 15.5 % Final  . Platelets 11/26/2012 204  150 - 400 K/uL Final  . Neutrophils Relative % 11/26/2012 86* 43 - 77 % Final  . Neutro Abs 11/26/2012 15.6* 1.7 - 7.7 K/uL Final  . Lymphocytes Relative 11/26/2012 5* 12 - 46 % Final  . Lymphs Abs 11/26/2012 1.0  0.7 - 4.0 K/uL Final  . Monocytes Relative 11/26/2012 8  3 - 12 % Final  . Monocytes Absolute 11/26/2012 1.5* 0.1 - 1.0 K/uL Final  . Eosinophils Relative 11/26/2012 0  0 - 5 % Final  . Eosinophils Absolute 11/26/2012 0.0  0.0 - 0.7 K/uL Final  . Basophils Relative 11/26/2012 0  0 - 1 % Final  . Basophils Absolute 11/26/2012 0.0  0.0 - 0.1 K/uL Final  . Sodium 11/26/2012 134* 135 - 145 mEq/L Final  . Potassium 11/26/2012 4.2  3.5 - 5.1 mEq/L Final  . Chloride 11/26/2012 98  96 - 112 mEq/L Final  . CO2 11/26/2012 24  19 - 32 mEq/L Final  . Glucose, Bld 11/26/2012 259* 70 - 99 mg/dL Final  . BUN 11/26/2012 15  6 - 23 mg/dL Final  . Creatinine, Ser 11/26/2012 0.65  0.50 - 1.10 mg/dL Final  . Calcium 11/26/2012 9.2  8.4 - 10.5 mg/dL Final  . Total Protein 11/26/2012 7.9  6.0 - 8.3 g/dL Final  . Albumin 11/26/2012 3.7  3.5 - 5.2 g/dL Final  . AST 11/26/2012 11  0 - 37 U/L Final  . ALT 11/26/2012 11  0 - 35 U/L Final  . Alkaline Phosphatase 11/26/2012 54  39 - 117 U/L Final  . Total Bilirubin 11/26/2012 0.7  0.3 - 1.2 mg/dL Final  . GFR calc non Af Amer 11/26/2012 >90  >90 mL/min Final  . GFR calc Af Amer 11/26/2012 >90  >90 mL/min Final   Comment:                                 The eGFR has been calculated                          using the CKD EPI equation.                          This calculation has not been                          validated in all clinical  situations.                          eGFR's persistently                          <90 mL/min signify                           possible Chronic Kidney Disease.  . Color, Urine 11/26/2012 AMBER* YELLOW Final   BIOCHEMICALS MAY BE AFFECTED BY COLOR  . APPearance 11/26/2012 CLOUDY* CLEAR Final  . Specific Gravity, Urine 11/26/2012 1.031* 1.005 - 1.030 Final  . pH 11/26/2012 5.5  5.0 - 8.0 Final  . Glucose, UA 11/26/2012 500* NEGATIVE mg/dL Final  . Hgb urine dipstick 11/26/2012 TRACE* NEGATIVE Final  . Bilirubin Urine 11/26/2012 NEGATIVE  NEGATIVE Final  . Ketones, ur 11/26/2012 >80* NEGATIVE mg/dL Final  . Protein, ur 11/26/2012 30* NEGATIVE mg/dL Final  . Urobilinogen, UA 11/26/2012 0.2  0.0 - 1.0 mg/dL Final  . Nitrite 11/26/2012 POSITIVE* NEGATIVE Final  . Leukocytes, UA 11/26/2012 SMALL* NEGATIVE Final  . Glucose-Capillary 11/26/2012 223* 70 - 99 mg/dL Final  . Comment 1 11/26/2012 Notify RN   Final  . Squamous Epithelial / LPF 11/26/2012 MANY* RARE Final  . WBC, UA 11/26/2012 11-20  <3 WBC/hpf Final  . Bacteria, UA 11/26/2012 MANY* RARE Final  . Urine-Other 11/26/2012 MUCOUS PRESENT   Final  . Specimen Description 11/26/2012 URINE, CLEAN CATCH   Final  . Special Requests 11/26/2012 NONE   Final  . Culture  Setup Time 11/26/2012 11/27/2012 02:25   Final  . Colony Count 11/26/2012 >=100,000 COLONIES/ML   Final  . Culture 11/26/2012    Final                   Value:ESCHERICHIA COLI                         ENTEROBACTER AEROGENES  . Report Status 11/26/2012 11/29/2012 FINAL   Final  . Organism ID, Bacteria 11/26/2012 ESCHERICHIA COLI   Final  . Organism ID, Bacteria 11/26/2012 ENTEROBACTER AEROGENES   Final  . Preg Test, Ur 11/26/2012 NEGATIVE  NEGATIVE Final   Comment:                                 THE SENSITIVITY OF THIS                          METHODOLOGY IS >24 mIU/mL  . Glucose-Capillary 11/27/2012 215* 70 - 99 mg/dL Final  . Glucose-Capillary 11/26/2012 242* 70 - 99 mg/dL Final   Heterozygous for factor V Leiden mutation   STUDIES: No results found.  ASSESSMENT AND MEDICAL DECISION  MAKING: Heterozygosity for factor V Leiden- Mrs. Sonya Mason has the most commonly detected type of hypercoagulable abnormality. Approximately 5% of Caucasians are heterozygous for this mutation. Although the relative risk of developing venous thromboembolism in heterozygotes for factor V Leiden is estimated to be approximately 4-7 times that of an abnormal population, the absolute risk of thrombotic episodes is very low, approximately 0.06% per year with only 5% of heterozygotes developing blood clots in their lifetime. In a female heterozygotes the most worrisome time for developing blood clots and is pregnancy or the time on  oral contraceptive pills. It is important to note that Mrs. Sonya Mason had 2 pregnancies, none of which was associated with DVT or PE, and that she is not on any oral contraceptive pills and does not plan to get pregnant again. Still, we discussed the fact that she absolutely should not be taking oral contraceptive pills or hormone replacement therapy , And that she should practice barrier contraception. I also explained to her that she should not be treated with prophylactic anticoagulation on a daily basis, no she should be treated any differently is compared to patients who do not carry this mutation, when and if she needs any surgical intervention or is admitted to the hospital. We also discussed the fact that her son has at least 50% chance of inheriting heterozygosity for factor V Leiden, and that there is no indication for testing in his case. Theoretically, his children also could carry this mutation, and if the mother off the child also carries this mutation, and 25% of cases the child could be homozygous for the mutation. Still, in this situation her son will need to decide on whether he wants to be tested amount. No additional evaluation or follow-up is required.   Patient expressed understanding and was in agreement with this plan. She also understands that She can call clinic at  any time with any questions, concerns, or complaints.    No matching staging information was found for the patient.  Roxana Hires, MD   05/14/2015 10:58 AM

## 2015-09-13 DIAGNOSIS — S96911A Strain of unspecified muscle and tendon at ankle and foot level, right foot, initial encounter: Secondary | ICD-10-CM | POA: Diagnosis not present

## 2015-12-02 DIAGNOSIS — E109 Type 1 diabetes mellitus without complications: Secondary | ICD-10-CM | POA: Diagnosis not present

## 2015-12-02 DIAGNOSIS — E049 Nontoxic goiter, unspecified: Secondary | ICD-10-CM | POA: Diagnosis not present

## 2015-12-02 DIAGNOSIS — Z9641 Presence of insulin pump (external) (internal): Secondary | ICD-10-CM | POA: Diagnosis not present

## 2015-12-29 DIAGNOSIS — J069 Acute upper respiratory infection, unspecified: Secondary | ICD-10-CM | POA: Diagnosis not present

## 2016-04-02 DIAGNOSIS — Z9641 Presence of insulin pump (external) (internal): Secondary | ICD-10-CM | POA: Diagnosis not present

## 2016-04-02 DIAGNOSIS — E049 Nontoxic goiter, unspecified: Secondary | ICD-10-CM | POA: Diagnosis not present

## 2016-04-02 DIAGNOSIS — R635 Abnormal weight gain: Secondary | ICD-10-CM | POA: Diagnosis not present

## 2016-04-02 DIAGNOSIS — E109 Type 1 diabetes mellitus without complications: Secondary | ICD-10-CM | POA: Diagnosis not present

## 2016-05-24 ENCOUNTER — Encounter: Payer: Self-pay | Admitting: Podiatry

## 2016-05-24 ENCOUNTER — Ambulatory Visit (INDEPENDENT_AMBULATORY_CARE_PROVIDER_SITE_OTHER): Payer: PRIVATE HEALTH INSURANCE | Admitting: Podiatry

## 2016-05-24 VITALS — BP 148/90 | HR 93 | Resp 16

## 2016-05-24 DIAGNOSIS — L03031 Cellulitis of right toe: Secondary | ICD-10-CM | POA: Diagnosis not present

## 2016-05-24 DIAGNOSIS — L02611 Cutaneous abscess of right foot: Secondary | ICD-10-CM

## 2016-05-24 DIAGNOSIS — L601 Onycholysis: Secondary | ICD-10-CM | POA: Diagnosis not present

## 2016-05-24 MED ORDER — SULFAMETHOXAZOLE-TRIMETHOPRIM 800-160 MG PO TABS: 1.0000 | ORAL_TABLET | Freq: Two times a day (BID) | ORAL | 0 refills | Status: DC

## 2016-05-24 NOTE — Patient Instructions (Signed)

## 2016-05-25 NOTE — Progress Notes (Signed)
Subjective: 40 year old female presents the office today for concerns of blistering to the right second toe as well as to a lesser degree the right third toe. She states that she was wearing cowboy boots while in [facility] last week and she noticed her toes are rubbing and she did a lot of walking.She has noticed that there has been increasing amount of redness to the 2nd digit. No red streaking is present.  Denies any systemic complaints such as fevers, chills, nausea, vomiting. No acute changes since last appointment, and no other complaints at this time.   Objective: AAO x3, NAD DP/PT pulses palpable bilaterally, CRT less than 3 seconds There is a hemorraghic filled bulla to the distal aspect of the right 2nd digit and long the proximal nail border to the second toe. The nail does appear to be loose from the proximal nail bed. Upon palpation of the toenail there does appear to be fluid underneath the toenails well.Minimal tenderness palpation. There is erythema to the dorsal aspect of he level of the PIPJ. No malodor. Smaller hemorraghic blister to the 3rd toe distally. This does not involve the toenail. No erythema.  No open lesions or pre-ulcerative lesions.  No pain with calf compression, swelling, warmth, erythema  Assessment: Left 2nd toe cellulitis; onycholysis   Plan: -All treatment options discussed with the patient including all alternatives, risks, complications.  -At this time, recommended total nail removal without chemical matricectomy to the right 2nd toenail due to infection. Risks and complications were discussed with the patient for which they understand and  verbally consent to the procedure. Under sterile conditions a total of 3 mL of a mixture of 2% lidocaine plain and 0.5% Marcaine plain was infiltrated in a hallux block fashion. Once anesthetized, the skin was prepped in sterile fashion. A tourniquet was then applied. Next the nail was excised making sure to remove the entire  offending nail border. Once the nail was  Removed, the area was debrided and the underlying skin was intact. The area was irrigated and hemostasis was obtained.  A dry sterile dressing was applied. After application of the dressing the tourniquet was removed and there is found to be an immediate capillary refill time to the digit. The patient tolerated the procedure well any complications. Post procedure instructions were discussed the patient for which he verbally understood. Follow-up in one week for nail check or sooner if any problems are to arise. Discussed signs/symptoms of worsening infection and directed to call the office immediately should any occur or go directly to the emergency room. In the meantime, encouraged to call the office with any questions, concerns, changes symptoms. -Keflex -Surgical shoe. -Patient encouraged to call the office with any questions, concerns, change in symptoms.   Celesta Gentile, DPM

## 2016-05-27 ENCOUNTER — Ambulatory Visit: Payer: BLUE CROSS/BLUE SHIELD | Admitting: Podiatry

## 2016-06-03 ENCOUNTER — Ambulatory Visit: Payer: PRIVATE HEALTH INSURANCE | Admitting: Podiatry

## 2016-08-03 DIAGNOSIS — E109 Type 1 diabetes mellitus without complications: Secondary | ICD-10-CM | POA: Diagnosis not present

## 2016-08-03 DIAGNOSIS — E049 Nontoxic goiter, unspecified: Secondary | ICD-10-CM | POA: Diagnosis not present

## 2016-08-03 DIAGNOSIS — Z9641 Presence of insulin pump (external) (internal): Secondary | ICD-10-CM | POA: Diagnosis not present

## 2017-01-26 ENCOUNTER — Ambulatory Visit (INDEPENDENT_AMBULATORY_CARE_PROVIDER_SITE_OTHER): Payer: BLUE CROSS/BLUE SHIELD | Admitting: Podiatry

## 2017-01-26 DIAGNOSIS — L02612 Cutaneous abscess of left foot: Secondary | ICD-10-CM

## 2017-01-26 DIAGNOSIS — L03032 Cellulitis of left toe: Secondary | ICD-10-CM | POA: Diagnosis not present

## 2017-01-26 MED ORDER — MUPIROCIN 2 % EX OINT: TOPICAL_OINTMENT | CUTANEOUS | 1 refills | Status: DC

## 2017-01-26 MED ORDER — DOXYCYCLINE HYCLATE 100 MG PO TABS: 100.0000 mg | ORAL_TABLET | Freq: Two times a day (BID) | ORAL | 0 refills | Status: DC

## 2017-01-26 NOTE — Progress Notes (Signed)
She presents today chief complaint of a possibly infected hallux left. She states that is been sore for the last 2 days but she probably cut it about 2 weeks ago. She states that she's been soaking it in Epsom salts and using peroxide. She states this started becoming red and swollen and painful she is even had pain in the calf. She is used ichthammol ointment with no relief.  Objective: Vital signs are stable she's alert and oriented 3. Pulses are palpable. A lot of sensorium is intact. Deep tendon reflexes are intact. Muscle strength is normal. Hallux left does demonstrated erythema extending to the level of the metatarsophalangeal joints with edema and tenderness on palpation. There is small laceration that appears to be healing but hasn't yet to heal. Radiographs were not taken today. She has no calf pain on palpation.  Assessment: Diabetes with cellulitis hallux left.  Plan: Start her on doxycycline 150 mg twice daily and also started her Epsom salts and warm water soaks to be followed by Bactroban ointment and a dressing to the toe. We will follow-up with her in 1 week otherwise she is to follow up with the emergency department should she notice worsening of the erythema worsening of the pain fever chills nausea or vomiting.

## 2017-01-27 ENCOUNTER — Emergency Department (HOSPITAL_COMMUNITY)
Admission: EM | Admit: 2017-01-27 | Discharge: 2017-01-27 | Disposition: A | Discharge status: Home / Self Care | Payer: BLUE CROSS/BLUE SHIELD | Attending: Emergency Medicine | Admitting: Emergency Medicine

## 2017-01-27 DIAGNOSIS — L539 Erythematous condition, unspecified: Secondary | ICD-10-CM | POA: Insufficient documentation

## 2017-01-27 DIAGNOSIS — Z5321 Procedure and treatment not carried out due to patient leaving prior to being seen by health care provider: Secondary | ICD-10-CM | POA: Insufficient documentation

## 2017-01-27 NOTE — ED Triage Notes (Signed)
Pt has a hx DM1 and states she had a cut on her L toe that has become infected. Given doxycyline by podiatrist but redness continues to progress. Alert and oriented.

## 2017-02-02 ENCOUNTER — Ambulatory Visit: Payer: BLUE CROSS/BLUE SHIELD | Admitting: Podiatry

## 2017-02-09 DIAGNOSIS — S30860A Insect bite (nonvenomous) of lower back and pelvis, initial encounter: Secondary | ICD-10-CM | POA: Diagnosis not present

## 2017-02-09 DIAGNOSIS — E109 Type 1 diabetes mellitus without complications: Secondary | ICD-10-CM | POA: Diagnosis not present

## 2017-02-09 DIAGNOSIS — Z9641 Presence of insulin pump (external) (internal): Secondary | ICD-10-CM | POA: Diagnosis not present

## 2017-02-09 DIAGNOSIS — E049 Nontoxic goiter, unspecified: Secondary | ICD-10-CM | POA: Diagnosis not present

## 2017-03-11 DIAGNOSIS — M7711 Lateral epicondylitis, right elbow: Secondary | ICD-10-CM | POA: Diagnosis not present

## 2017-04-13 DIAGNOSIS — Z1231 Encounter for screening mammogram for malignant neoplasm of breast: Secondary | ICD-10-CM | POA: Diagnosis not present

## 2017-04-13 DIAGNOSIS — Z6832 Body mass index (BMI) 32.0-32.9, adult: Secondary | ICD-10-CM | POA: Diagnosis not present

## 2017-04-13 DIAGNOSIS — Z01419 Encounter for gynecological examination (general) (routine) without abnormal findings: Secondary | ICD-10-CM | POA: Diagnosis not present

## 2017-04-13 DIAGNOSIS — E039 Hypothyroidism, unspecified: Secondary | ICD-10-CM | POA: Diagnosis not present

## 2017-05-10 DIAGNOSIS — R87612 Low grade squamous intraepithelial lesion on cytologic smear of cervix (LGSIL): Secondary | ICD-10-CM | POA: Diagnosis not present

## 2017-05-10 DIAGNOSIS — R87611 Atypical squamous cells cannot exclude high grade squamous intraepithelial lesion on cytologic smear of cervix (ASC-H): Secondary | ICD-10-CM | POA: Diagnosis not present

## 2017-05-27 DIAGNOSIS — E109 Type 1 diabetes mellitus without complications: Secondary | ICD-10-CM | POA: Diagnosis not present

## 2017-06-01 DIAGNOSIS — E039 Hypothyroidism, unspecified: Secondary | ICD-10-CM | POA: Diagnosis not present

## 2017-06-01 DIAGNOSIS — Y838 Other surgical procedures as the cause of abnormal reaction of the patient, or of later complication, without mention of misadventure at the time of the procedure: Secondary | ICD-10-CM | POA: Diagnosis not present

## 2017-06-01 DIAGNOSIS — E1010 Type 1 diabetes mellitus with ketoacidosis without coma: Secondary | ICD-10-CM | POA: Diagnosis not present

## 2017-06-01 DIAGNOSIS — T85614A Breakdown (mechanical) of insulin pump, initial encounter: Secondary | ICD-10-CM | POA: Diagnosis not present

## 2017-06-01 DIAGNOSIS — E860 Dehydration: Secondary | ICD-10-CM | POA: Diagnosis not present

## 2017-06-01 DIAGNOSIS — Z79899 Other long term (current) drug therapy: Secondary | ICD-10-CM | POA: Diagnosis not present

## 2017-06-01 DIAGNOSIS — R4182 Altered mental status, unspecified: Secondary | ICD-10-CM | POA: Diagnosis not present

## 2017-06-01 DIAGNOSIS — Z9641 Presence of insulin pump (external) (internal): Secondary | ICD-10-CM | POA: Diagnosis not present

## 2017-06-01 DIAGNOSIS — R0789 Other chest pain: Secondary | ICD-10-CM | POA: Diagnosis not present

## 2017-06-01 DIAGNOSIS — E1065 Type 1 diabetes mellitus with hyperglycemia: Secondary | ICD-10-CM | POA: Diagnosis not present

## 2017-06-01 DIAGNOSIS — E871 Hypo-osmolality and hyponatremia: Secondary | ICD-10-CM | POA: Diagnosis not present

## 2017-06-01 DIAGNOSIS — R739 Hyperglycemia, unspecified: Secondary | ICD-10-CM | POA: Diagnosis not present

## 2017-06-01 DIAGNOSIS — R9431 Abnormal electrocardiogram [ECG] [EKG]: Secondary | ICD-10-CM | POA: Diagnosis not present

## 2017-06-01 DIAGNOSIS — Z880 Allergy status to penicillin: Secondary | ICD-10-CM | POA: Diagnosis not present

## 2017-06-01 DIAGNOSIS — R000 Tachycardia, unspecified: Secondary | ICD-10-CM | POA: Diagnosis not present

## 2017-06-01 DIAGNOSIS — Z794 Long term (current) use of insulin: Secondary | ICD-10-CM | POA: Diagnosis not present

## 2017-06-02 DIAGNOSIS — E1010 Type 1 diabetes mellitus with ketoacidosis without coma: Secondary | ICD-10-CM | POA: Diagnosis not present

## 2017-06-02 DIAGNOSIS — Z9641 Presence of insulin pump (external) (internal): Secondary | ICD-10-CM | POA: Diagnosis not present

## 2017-06-02 DIAGNOSIS — T85614A Breakdown (mechanical) of insulin pump, initial encounter: Secondary | ICD-10-CM | POA: Diagnosis not present

## 2017-06-02 DIAGNOSIS — E1065 Type 1 diabetes mellitus with hyperglycemia: Secondary | ICD-10-CM | POA: Diagnosis not present

## 2017-06-02 DIAGNOSIS — Z794 Long term (current) use of insulin: Secondary | ICD-10-CM | POA: Diagnosis not present

## 2017-06-02 DIAGNOSIS — E039 Hypothyroidism, unspecified: Secondary | ICD-10-CM | POA: Diagnosis not present

## 2017-06-06 DIAGNOSIS — E039 Hypothyroidism, unspecified: Secondary | ICD-10-CM | POA: Diagnosis not present

## 2017-06-06 DIAGNOSIS — Z9641 Presence of insulin pump (external) (internal): Secondary | ICD-10-CM | POA: Diagnosis not present

## 2017-06-06 DIAGNOSIS — E109 Type 1 diabetes mellitus without complications: Secondary | ICD-10-CM | POA: Diagnosis not present

## 2017-06-07 HISTORY — PX: FOOT SURGERY: SHX648

## 2017-06-20 DIAGNOSIS — H01024 Squamous blepharitis left upper eyelid: Secondary | ICD-10-CM | POA: Diagnosis not present

## 2017-06-20 DIAGNOSIS — H01021 Squamous blepharitis right upper eyelid: Secondary | ICD-10-CM | POA: Diagnosis not present

## 2017-06-20 DIAGNOSIS — H01022 Squamous blepharitis right lower eyelid: Secondary | ICD-10-CM | POA: Diagnosis not present

## 2017-06-20 DIAGNOSIS — E113293 Type 2 diabetes mellitus with mild nonproliferative diabetic retinopathy without macular edema, bilateral: Secondary | ICD-10-CM | POA: Diagnosis not present

## 2017-08-08 DIAGNOSIS — E109 Type 1 diabetes mellitus without complications: Secondary | ICD-10-CM | POA: Diagnosis not present

## 2017-09-27 ENCOUNTER — Ambulatory Visit: Payer: BLUE CROSS/BLUE SHIELD | Admitting: Podiatry

## 2017-09-27 ENCOUNTER — Ambulatory Visit (INDEPENDENT_AMBULATORY_CARE_PROVIDER_SITE_OTHER): Payer: BLUE CROSS/BLUE SHIELD

## 2017-09-27 DIAGNOSIS — M722 Plantar fascial fibromatosis: Secondary | ICD-10-CM

## 2017-09-27 DIAGNOSIS — R609 Edema, unspecified: Secondary | ICD-10-CM | POA: Diagnosis not present

## 2017-09-27 DIAGNOSIS — M654 Radial styloid tenosynovitis [de Quervain]: Secondary | ICD-10-CM | POA: Diagnosis not present

## 2017-09-27 MED ORDER — MELOXICAM 15 MG PO TABS: 15.0000 mg | ORAL_TABLET | Freq: Every day | ORAL | 0 refills | Status: DC

## 2017-09-27 NOTE — Patient Instructions (Signed)

## 2017-09-28 NOTE — Progress Notes (Signed)
Subjective: 42 year old female presents the office with concerns of left heel pain.  She states that the pain started a few weeks ago. The pain was only in the morning when she first got out of bed because the pain is really progressed and become more consistent during the day.  She denies any recent injury or trauma to the area she denies any redness.  She has noticed some swelling to the foot.  This is worse towards the end of the day.  Continue supportive shoes.  Also discussed orthotics.  The pain does not wake up at night.  She has no other concerns today. Denies any systemic complaints such as fevers, chills, nausea, vomiting. No acute changes since last appointment, and no other complaints at this time.   Objective: AAO x3, NAD DP/PT pulses palpable bilaterally, CRT less than 3 seconds Tenderness to palpation along the plantar medial tubercle of the calcaneus at the insertion of plantar fascia on the left foot. There is no pain along the course of the plantar fascia within the arch of the foot. Plantar fascia appears to be intact. There is no pain with lateral compression of the calcaneus or pain with vibratory sensation. There is no pain along the course or insertion of the achilles tendon. No other areas of tenderness to bilateral lower extremities.  Negative Tinel sign present.  He does appear to be swelling to the left foot compared to the contralateral extremity.  No other area tenderness. No open lesions or pre-ulcerative lesions.  No pain with calf compression, swelling, warmth, erythema  Assessment: Left foot plantar fasciitis, swelling  Plan: -All treatment options discussed with the patient including all alternatives, risks, complications.  -X-rays were obtained and reviewed.  No definitive evidence of acute fracture or stress fracture was identified today.  Mild midfoot arthritic changes present. -Steroid injection performed.  See procedure note below. -Prescribed mobic. Discussed  side effects of the medication and directed to stop if any are to occur and call the office.  -Discussed stretching, icing exercises daily. -Also discussed shoe modifications and orthotics. -Plantar fascial brace -I will continue to monitor for the swelling.  There is no signs of DVT today there is no other areas of tenderness the foot. -Follow-up in 3 weeks or sooner if needed. -Patient encouraged to call the office with any questions, concerns, change in symptoms.   Procedure: Injection Tendon/Ligament Discussed alternatives, risks, complications and verbal consent was obtained.  Location: Left plantar fascia at the glabrous junction; medial approach. Skin Prep: Alcohol. Injectate: 0.5cc 0.5% marcaine plain, 0.5 cc 2% lidocaine plain and, 1 cc kenalog 10. Disposition: Patient tolerated procedure well. Injection site dressed with a band-aid.  Post-injection care was discussed and return precautions discussed.   Trula Slade DPM

## 2017-10-04 DIAGNOSIS — M25572 Pain in left ankle and joints of left foot: Secondary | ICD-10-CM | POA: Diagnosis not present

## 2017-10-13 ENCOUNTER — Ambulatory Visit: Payer: BLUE CROSS/BLUE SHIELD | Admitting: Podiatry

## 2017-10-17 ENCOUNTER — Ambulatory Visit: Payer: BLUE CROSS/BLUE SHIELD | Admitting: Podiatry

## 2017-10-17 ENCOUNTER — Encounter: Payer: Self-pay | Admitting: Podiatry

## 2017-10-17 ENCOUNTER — Ambulatory Visit (INDEPENDENT_AMBULATORY_CARE_PROVIDER_SITE_OTHER): Payer: BLUE CROSS/BLUE SHIELD

## 2017-10-17 DIAGNOSIS — M722 Plantar fascial fibromatosis: Secondary | ICD-10-CM

## 2017-10-17 DIAGNOSIS — I739 Peripheral vascular disease, unspecified: Secondary | ICD-10-CM

## 2017-10-17 DIAGNOSIS — R609 Edema, unspecified: Secondary | ICD-10-CM

## 2017-10-17 MED ORDER — MELOXICAM 15 MG PO TABS: 15.0000 mg | ORAL_TABLET | Freq: Every day | ORAL | 2 refills | Status: AC

## 2017-10-18 ENCOUNTER — Ambulatory Visit (HOSPITAL_COMMUNITY)
Admission: RE | Admit: 2017-10-18 | Discharge: 2017-10-18 | Disposition: A | Discharge status: Home / Self Care | Payer: BLUE CROSS/BLUE SHIELD | Source: Ambulatory Visit | Attending: Cardiovascular Disease | Admitting: Cardiovascular Disease

## 2017-10-18 ENCOUNTER — Telehealth: Payer: Self-pay | Admitting: *Deleted

## 2017-10-18 ENCOUNTER — Other Ambulatory Visit: Payer: Self-pay | Admitting: Podiatry

## 2017-10-18 DIAGNOSIS — M7989 Other specified soft tissue disorders: Secondary | ICD-10-CM | POA: Diagnosis not present

## 2017-10-18 DIAGNOSIS — R609 Edema, unspecified: Secondary | ICD-10-CM

## 2017-10-18 DIAGNOSIS — M79662 Pain in left lower leg: Secondary | ICD-10-CM

## 2017-10-18 NOTE — Telephone Encounter (Signed)
[facility] - [facility] scheduled pt for today arrive 11:45am for 12:00pm venous doppler for r/o DVT left leg.

## 2017-10-18 NOTE — Progress Notes (Signed)
Subjective: 42 year old female presents the office today for follow-up evaluation of continued pain to her left foot.  She states the injection did not help to her heel.  She said that she is been very active on her feet she continued pain to the bottom of her heel.  She also reported that her left leg is been swollen compared to her right side.  Today she also does report after I discussed her getting an ultrasound to rule out a DVT she was tested previously she has factor V Leiden. Denies any systemic complaints such as fevers, chills, nausea, vomiting. No acute changes since last appointment, and no other complaints at this time.   Objective: AAO x3, NAD DP/PT pulses palpable bilaterally, CRT less than 3 seconds Tenderness to palpation along the plantar medial tubercle of the calcaneus at the insertion of plantar fascia on the left foot. There is no pain along the course of the plantar fascia within the arch of the foot. Plantar fascia appears to be intact. There is no pain with lateral compression of the calcaneus or pain with vibratory sensation. There is no pain along the course or insertion of the achilles tendon. No other areas of tenderness to bilateral lower extremities. There is swelling to the left leg but there is no pain with calf compression or erythema. No open lesions or pre-ulcerative lesions.   Assessment:  Left heel pain, plantar fasciitis, left leg swelling  Plan: -All treatment options discussed with the patient including all alternatives, risks, complications.  -Repeat x-rays were obtained repeat appears no definitive evidence of acute fracture or stress fracture identified today.  However given her continued pain I recommend immobilization in the cam boot and this was dispensed today.  Continue to ice the area daily.  Anti-inflammatories as needed and a refill of her meloxicam as she did not get this last appointment. -Venous duplex ordered to rule out DVT -ABIs performed in  the office today which was normal. -Patient encouraged to call the office with any questions, concerns, change in symptoms.   Trula Slade DPM

## 2017-10-18 NOTE — Telephone Encounter (Signed)
-----   Message from Trula Slade, DPM sent at 10/18/2017  7:08 AM EDT ----- Can you please order a venous duplex to rule out DVT? Thanks.

## 2017-10-18 NOTE — Telephone Encounter (Signed)
I informed pt of today's doppler appt.

## 2017-10-20 NOTE — Telephone Encounter (Signed)
-----   Message from Trula Slade, DPM sent at 10/19/2017  4:35 PM EDT ----- Negative for DVT. Val-Please let her know. Thanks.

## 2017-10-20 NOTE — Telephone Encounter (Signed)
Left message on pt's mobile phone and home phone to call for venous doppler results.

## 2017-10-21 NOTE — Telephone Encounter (Signed)
Left message on home phone requesting call back to discuss doppler results. Left message on pt's mobile phone to call for doppler results.

## 2017-10-21 NOTE — Telephone Encounter (Signed)
Pt states she was informed of venous doppler results by [facility].

## 2017-10-27 ENCOUNTER — Ambulatory Visit: Payer: BLUE CROSS/BLUE SHIELD | Admitting: Podiatry

## 2017-11-03 ENCOUNTER — Encounter: Payer: Self-pay | Admitting: Podiatry

## 2017-11-03 ENCOUNTER — Ambulatory Visit: Payer: BLUE CROSS/BLUE SHIELD | Admitting: Podiatry

## 2017-11-03 DIAGNOSIS — M779 Enthesopathy, unspecified: Secondary | ICD-10-CM

## 2017-11-03 DIAGNOSIS — M722 Plantar fascial fibromatosis: Secondary | ICD-10-CM

## 2017-11-04 ENCOUNTER — Telehealth: Payer: Self-pay | Admitting: *Deleted

## 2017-11-04 NOTE — Telephone Encounter (Signed)
DR. Jacqualyn Posey ordered MRI left ankle orders to Gretta Arab, RN for Pre-cert.

## 2017-11-07 ENCOUNTER — Telehealth: Payer: Self-pay | Admitting: Podiatry

## 2017-11-07 NOTE — Telephone Encounter (Signed)
I saw Dr. Jacqualyn Posey on Thursday. He said he was going to schedule me for an MRI and said someone would call me. I haven't heard anything yet, so I was just calling to follow up on that. If someone would give me a call back at [phone]. Thank you.

## 2017-11-07 NOTE — Progress Notes (Signed)
Subjective: 42 year old female presents the office today for follow-up evaluation of continued pain to her left foot.  She is doing quite a bit of swelling to her foot.  Overall she does not have any significant resolution she is having quite a bit of swelling.  She does not wear the cam boot at all times as she is active in the yard.  No recent falls or injury other than she does relate that she jumped from a small distance before this started and not sure if that had any do with what is going on.    Objective: AAO x3, NAD DP/PT pulses palpable bilaterally, CRT less than 3 seconds Tenderness to palpation along the plantar medial tubercle of the calcaneus at the insertion of plantar fascia on the left foot. There is no pain along the course of the plantar fascia within the arch of the foot. Plantar fascia appears to be intact. There is no pain with lateral compression of the calcaneus or pain with vibratory sensation. There is no pain along the course or insertion of the achilles tendon.  There is tenderness on the peroneal tendon today just posterior and inferior to the lateral malleolus.  No other areas of tenderness to bilateral lower extremities. There is swelling to the left leg but there is no pain with calf compression or erythema. No open lesions or pre-ulcerative lesions.   Assessment:  Left heel pain, plantar fasciitis, rule out tendon tear  Plan: -All treatment options discussed with the patient including all alternatives, risks, complications.  -At this point given her ongoing swelling and pain I recommend an MRI to rule out a tear.  I gave the information to [facility] to order ASAP.  Continue cam boot.  Ice elevation.  We will hold off on further steroid injection as it was not helpful previously.  He can also hold Mobic as it is not helpful either.  Follow-up after MRI or sooner if any issues are to arise.  Trula Slade DPM

## 2017-11-07 NOTE — Telephone Encounter (Signed)
I informed pt we were waiting for the pre-cert, the MRI had been ordered at [facility].

## 2017-11-10 ENCOUNTER — Telehealth: Payer: Self-pay | Admitting: Podiatry

## 2017-11-10 NOTE — Telephone Encounter (Signed)
I'm waiting to get an appointment for an MRI that Dr. Jacqualyn Posey ordered. I know that you were working on getting authorization but it's been over a week now and this is getting a little ridiculous. So if someone could give me a call back at [phone]. Thank you.

## 2017-11-10 NOTE — Telephone Encounter (Addendum)
Left message informing pt the MRI had received approval on 11/08/2017 and she could call [facility] main scheduling [phone] to set up her appt.

## 2017-11-16 ENCOUNTER — Ambulatory Visit (HOSPITAL_COMMUNITY)
Admission: RE | Admit: 2017-11-16 | Discharge: 2017-11-16 | Disposition: A | Discharge status: Home / Self Care | Payer: BLUE CROSS/BLUE SHIELD | Source: Ambulatory Visit | Attending: Podiatry | Admitting: Podiatry

## 2017-11-16 DIAGNOSIS — M722 Plantar fascial fibromatosis: Secondary | ICD-10-CM | POA: Insufficient documentation

## 2017-11-16 DIAGNOSIS — M779 Enthesopathy, unspecified: Secondary | ICD-10-CM | POA: Insufficient documentation

## 2017-11-16 DIAGNOSIS — M659 Synovitis and tenosynovitis, unspecified: Secondary | ICD-10-CM | POA: Insufficient documentation

## 2017-11-16 DIAGNOSIS — S92215A Nondisplaced fracture of cuboid bone of left foot, initial encounter for closed fracture: Secondary | ICD-10-CM | POA: Diagnosis not present

## 2017-11-16 DIAGNOSIS — X58XXXA Exposure to other specified factors, initial encounter: Secondary | ICD-10-CM | POA: Insufficient documentation

## 2017-11-18 DIAGNOSIS — E109 Type 1 diabetes mellitus without complications: Secondary | ICD-10-CM | POA: Diagnosis not present

## 2017-11-21 ENCOUNTER — Telehealth: Payer: Self-pay | Admitting: *Deleted

## 2017-11-21 ENCOUNTER — Telehealth: Payer: Self-pay | Admitting: Podiatry

## 2017-11-21 NOTE — Telephone Encounter (Signed)
I had an MRI on Wednesday and I didn't know if I needed to schedule an appointment to go over the results or if the doctor will call me. If you could call me back at [phone]. Thank you.

## 2017-11-21 NOTE — Telephone Encounter (Signed)
I informed pt of Dr. Leigh Aurora review of results and orders, transferred pt to schedulers.

## 2017-11-21 NOTE — Telephone Encounter (Signed)
-----   Message from Trula Slade, DPM sent at 11/18/2017  7:56 AM EDT ----- Janett Billow or Val- can you let her know the MRI shows there is a nondisplaced fracture of the cuboid which cannot be seen on x-ray and also there is severe plantar fasciitis as well as some tendonitis on the outside of her ankle. She needs to go back into her CAM boot if she is not wearing it. She should wear the boot at all times. Please schedule her a follow-up in about 2 weeks for repeat x-ray and follow-up. Thanks.

## 2017-12-02 ENCOUNTER — Ambulatory Visit (INDEPENDENT_AMBULATORY_CARE_PROVIDER_SITE_OTHER): Payer: BLUE CROSS/BLUE SHIELD

## 2017-12-02 ENCOUNTER — Ambulatory Visit: Payer: BLUE CROSS/BLUE SHIELD | Admitting: Podiatry

## 2017-12-02 DIAGNOSIS — M722 Plantar fascial fibromatosis: Secondary | ICD-10-CM

## 2017-12-02 DIAGNOSIS — S92215A Nondisplaced fracture of cuboid bone of left foot, initial encounter for closed fracture: Secondary | ICD-10-CM

## 2017-12-03 NOTE — Progress Notes (Signed)
Subjective: 42 year old female presents the office today for follow-up evaluation of left foot pain.  She has been in the boot since we called her the move for that she states that she was wearing the boot more.  Is internal wear more consistently.  She states that she still gets pain mostly also aspect of her foot as well as the plantar fascia.  Swelling has improved but does continue.  Denies any recent injury or changes since the last saw her she has no other concerns today. Denies any systemic complaints such as fevers, chills, nausea, vomiting. No acute changes since last appointment, and no other complaints at this time.   Objective: AAO x3, NAD DP/PT pulses palpable bilaterally, CRT less than 3 seconds There is continuation of tenderness on the plantar medial aspect of the calcaneus at the insertion of plantar fascia.  The majority tenderness however does appear to be more the lateral aspect of the foot.  There is mild discomfort on the peroneal tendon.  There is also tenderness to palpation treatment of the cuboid.  Upon palpation of this area she does have more pain to the lateral aspect the foot and the plantar fascia but both are painful.  No open lesions or pre-ulcerative lesions.  No pain with calf compression, swelling, warmth, erythema  Assessment: 42 year old female with left foot plantar fasciitis, peroneal tendinitis, cuboid fracture  Plan: -All treatment options discussed with the patient including all alternatives, risks, complications.  -X-rays were obtained and reviewed.  On the lateral view there is a radiolucency in the cuboid which is not identified previously.  No other evidence of acute fracture identified. -We discussed both conservative as well as surgical options.  In regards to the cuboid fracture discussed prolonged immobilization.  In regards to the clinic plantar fasciitis and given the severity of it we discussed an endoscopic plantar fascial release.  For now and  continue with the cam boot the next couple weeks.  See back in 2 weeks and see how she is doing if we need to consider surgical intervention. -Patient encouraged to call the office with any questions, concerns, change in symptoms.    Trula Slade DPM

## 2017-12-06 DIAGNOSIS — E039 Hypothyroidism, unspecified: Secondary | ICD-10-CM | POA: Diagnosis not present

## 2017-12-06 DIAGNOSIS — Z9641 Presence of insulin pump (external) (internal): Secondary | ICD-10-CM | POA: Diagnosis not present

## 2017-12-06 DIAGNOSIS — E049 Nontoxic goiter, unspecified: Secondary | ICD-10-CM | POA: Diagnosis not present

## 2017-12-06 DIAGNOSIS — E109 Type 1 diabetes mellitus without complications: Secondary | ICD-10-CM | POA: Diagnosis not present

## 2017-12-15 ENCOUNTER — Ambulatory Visit: Payer: BLUE CROSS/BLUE SHIELD | Admitting: Podiatry

## 2017-12-19 ENCOUNTER — Ambulatory Visit (INDEPENDENT_AMBULATORY_CARE_PROVIDER_SITE_OTHER): Payer: BLUE CROSS/BLUE SHIELD

## 2017-12-19 ENCOUNTER — Ambulatory Visit: Payer: BLUE CROSS/BLUE SHIELD | Admitting: Podiatry

## 2017-12-19 ENCOUNTER — Encounter: Payer: Self-pay | Admitting: Podiatry

## 2017-12-19 ENCOUNTER — Telehealth: Payer: Self-pay | Admitting: *Deleted

## 2017-12-19 DIAGNOSIS — M722 Plantar fascial fibromatosis: Secondary | ICD-10-CM | POA: Diagnosis not present

## 2017-12-19 DIAGNOSIS — S92215A Nondisplaced fracture of cuboid bone of left foot, initial encounter for closed fracture: Secondary | ICD-10-CM | POA: Diagnosis not present

## 2017-12-19 DIAGNOSIS — S92215D Nondisplaced fracture of cuboid bone of left foot, subsequent encounter for fracture with routine healing: Secondary | ICD-10-CM

## 2017-12-19 NOTE — Telephone Encounter (Signed)
"  I was there today to see Dr. Jacqualyn Posey.  I'm scheduled for surgery.  Do I need to come back in to see him?"  Yes, you will need to schedule an appointment to come back in and see him.  "When do I need to do that?  Should I wait closer to the surgery date?"  It does not matter.  You can schedule it at your convenience.  "I'll call back to schedule it."

## 2017-12-20 NOTE — Progress Notes (Signed)
Subjective: 42 year old female presents the office for follow-up evaluation of left foot pain.  She states that he is getting more pain on the heel and she points on the plantar medial aspect where she is the majority of her tenderness.  She does appear the outside has gotten better.  She wears a cam boot when she is at work when she gets home she does not wear the boot.  Swelling to her foot is also improved but overall her pain is mostly on the plantar fascia.  She also states that she saw her primary care physician and her A1c has been in the nines.  She states that her insulin pump is been adjusted. Denies any systemic complaints such as fevers, chills, nausea, vomiting. No acute changes since last appointment, and no other complaints at this time.   Objective: AAO x3, NAD DP/PT pulses palpable bilaterally, CRT less than 3 seconds There is continuation of tenderness on the plantar medial tubercle of the calcaneus at the insertion of plantar fashion this appears to be the majority tenderness is.  No pain to the central lateral band.  Mild discomfort along the peroneal tendon.  No significant discomfort tracking on the cuboid there is no pain to vibratory sensation.  There is decrease edema to the foot.  No open lesions or pre-ulcerative lesions.  No pain with calf compression, swelling, warmth, erythema  Assessment: Chronic plantar fasciitis, cuboid fracture  Plan: -All treatment options discussed with the patient including all alternatives, risks, complications.  -X-rays were obtained and reviewed.  Unable to identify the cuboid fracture.  The area that I think on the lateral view of the left wound appears to be healing well.  Majority of symptoms appear to be along the course of the plantar fashion.  Discussed at this point given prolonged immobilization and continued pain endoscopic plantar fascial release to release the medial band as well as a PRP injection on the plantar fashion peroneal  tendons.  She was to do this relatively plan her for July 31 however see her back before that for consent.  She is also been like to have a new A1c performed prior to that.  I will keep a close check on her blood sugars. -Patient encouraged to call the office with any questions, concerns, change in symptoms.   Trula Slade DPM

## 2017-12-21 ENCOUNTER — Ambulatory Visit: Payer: BLUE CROSS/BLUE SHIELD | Admitting: Podiatry

## 2017-12-28 DIAGNOSIS — E109 Type 1 diabetes mellitus without complications: Secondary | ICD-10-CM | POA: Diagnosis not present

## 2018-01-02 ENCOUNTER — Telehealth: Payer: Self-pay | Admitting: *Deleted

## 2018-01-02 NOTE — Telephone Encounter (Signed)
We have discussed this. Also she was going to go back to get a new A1c done by her PCP. Did she get this done???

## 2018-01-02 NOTE — Telephone Encounter (Signed)
I am calling because you have not scheduled a consultation with Dr. Jacqualyn Posey to go over your surgery.  I spoke to you when you called to schedule your surgery.  I informed you then that you would need to see Dr. Jacqualyn Posey for a consultation.    "Do I have to come there to see him?  Can I just sign them the morning of?"  You cannot sign them the morning of the surgery date.  You need to see him so he can go over the surgery in detail and give you a surgical kit.  Can you come in tomorrow?  "I guess I need to see him."  Would you like me to transfer you to a scheduler, so you can make your appointment?  "Yes, that will be fine."  I transferred the call to [facility].

## 2018-01-03 ENCOUNTER — Ambulatory Visit (INDEPENDENT_AMBULATORY_CARE_PROVIDER_SITE_OTHER): Payer: BLUE CROSS/BLUE SHIELD | Admitting: Podiatry

## 2018-01-03 ENCOUNTER — Encounter: Payer: Self-pay | Admitting: Podiatry

## 2018-01-03 DIAGNOSIS — M779 Enthesopathy, unspecified: Secondary | ICD-10-CM | POA: Diagnosis not present

## 2018-01-03 DIAGNOSIS — M722 Plantar fascial fibromatosis: Secondary | ICD-10-CM | POA: Diagnosis not present

## 2018-01-03 DIAGNOSIS — S92215D Nondisplaced fracture of cuboid bone of left foot, subsequent encounter for fracture with routine healing: Secondary | ICD-10-CM

## 2018-01-03 MED ORDER — PROMETHAZINE HCL 25 MG PO TABS: 25.0000 mg | ORAL_TABLET | Freq: Three times a day (TID) | ORAL | 0 refills | Status: DC | PRN

## 2018-01-03 MED ORDER — CLINDAMYCIN HCL 300 MG PO CAPS: 300.0000 mg | ORAL_CAPSULE | Freq: Three times a day (TID) | ORAL | 0 refills | Status: DC

## 2018-01-03 MED ORDER — OXYCODONE-ACETAMINOPHEN 5-325 MG PO TABS: 1.0000 | ORAL_TABLET | Freq: Four times a day (QID) | ORAL | 0 refills | Status: DC | PRN

## 2018-01-03 MED ORDER — OXYCODONE-ACETAMINOPHEN 5-325 MG PO TABS: 1.0000 | ORAL_TABLET | ORAL | 0 refills | Status: DC | PRN

## 2018-01-03 NOTE — Patient Instructions (Signed)

## 2018-01-03 NOTE — Progress Notes (Signed)
Subjective: 42 year old female presents the office today for surgical consultation.  She states that she is having pain to her left foot.  The majority of pain is to the bottom of her heel and she is scheduled for endoscopic plantar fascial release tomorrow.  She occasionally gets some pain the also aspect the foot is becoming more intermittent.  She still has some swelling to the foot and ankle as well.  She denies any recent injury or trauma.  She presents today wearing flip-flops.  She has no other concerns. Denies any systemic complaints such as fevers, chills, nausea, vomiting. No acute changes since last appointment, and no other complaints at this time.   Objective: AAO x3, NAD DP/PT pulses palpable bilaterally, CRT less than 3 seconds There is continuation of tenderness on the plantar medial tubercle of the calcaneus at the insertion of plantar fashion the left side.  There is no significant discomfort on the arch of the foot.  Minimal discomfort in the lateral aspect of foot on the cuboid however there is no specific tenderness today upon palpation to the peroneal tendon.  The tendons appear to be intact.  Achilles tendon intact.  No other area tenderness. No open lesions or pre-ulcerative lesions.  No pain with calf compression, swelling, warmth, erythema  Assessment: Left chronic plantar fasciitis with partial tear, cuboid fracture, peroneal tendon-itis  Plan: -All treatment options discussed with the patient including all alternatives, risks, complications.  -She is scheduled for surgery tomorrow.  We can discuss both conservative and surgical options and she was to proceed with surgery.  She understands this is not a guarantee resolution of symptoms.  She is scheduled tomorrow for endoscopic plantar fascial release as well as PRP injection of the ankle and foot.  We can discuss the surgery as well as postoperative course. -The incision placement as well as the postoperative course was  discussed with the patient. I discussed risks of the surgery which include, but not limited to, infection, bleeding, pain, swelling, need for further surgery, delayed or nonhealing, painful or ugly scar, numbness or sensation changes, over/under correction, recurrence, transfer lesions, further deformity, hardware failure, DVT/PE, loss of toe/foot. Patient understands these risks and wishes to proceed with surgery. The surgical consent was reviewed with the patient all 3 pages were signed. No promises or guarantees were given to the outcome of the procedure. All questions were answered to the best of my ability. Before the surgery the patient was encouraged to call the office if there is any further questions. The surgery will be performed at the [facility] on an outpatient basis. -Post-operative medications were ordered for her.  -She has no further questions or concerns today.  Trula Slade DPM

## 2018-01-04 ENCOUNTER — Encounter: Payer: Self-pay | Admitting: Podiatry

## 2018-01-04 DIAGNOSIS — M25572 Pain in left ankle and joints of left foot: Secondary | ICD-10-CM | POA: Diagnosis not present

## 2018-01-04 DIAGNOSIS — M722 Plantar fascial fibromatosis: Secondary | ICD-10-CM

## 2018-01-04 DIAGNOSIS — E039 Hypothyroidism, unspecified: Secondary | ICD-10-CM | POA: Diagnosis not present

## 2018-01-04 DIAGNOSIS — M65872 Other synovitis and tenosynovitis, left ankle and foot: Secondary | ICD-10-CM | POA: Diagnosis not present

## 2018-01-09 ENCOUNTER — Telehealth: Payer: Self-pay | Admitting: *Deleted

## 2018-01-09 ENCOUNTER — Encounter: Payer: Self-pay | Admitting: Podiatry

## 2018-01-09 ENCOUNTER — Ambulatory Visit (INDEPENDENT_AMBULATORY_CARE_PROVIDER_SITE_OTHER): Payer: BLUE CROSS/BLUE SHIELD | Admitting: Podiatry

## 2018-01-09 DIAGNOSIS — M779 Enthesopathy, unspecified: Secondary | ICD-10-CM

## 2018-01-09 DIAGNOSIS — M722 Plantar fascial fibromatosis: Secondary | ICD-10-CM

## 2018-01-09 DIAGNOSIS — S92215D Nondisplaced fracture of cuboid bone of left foot, subsequent encounter for fracture with routine healing: Secondary | ICD-10-CM

## 2018-01-09 NOTE — Telephone Encounter (Signed)
Called and spoke with the patient and the patient stated that there was no any pain and there was no fever or chills and no nausea and I stated to the patient to call the office if any concerns or questions and that we would see patient next week for her appointment and there was a few times that the patient's phone was going in and out and I could not hear her. Sonya Mason

## 2018-01-11 NOTE — Progress Notes (Signed)
Subjective: Sonya Mason is a 42 y.o. is seen today in office s/p left foot endoscopic plantar fasciotomy with PRP injection preformed on 01/04/2018. They state their pain is controlled and she is not taking any pain medication.  Overall she is feeling better than she did prior to surgery.  She is been wearing the cam boot.  She is been trying to keep pressure off of her foot.. Denies any systemic complaints such as fevers, chills, nausea, vomiting. No calf pain, chest pain, shortness of breath.   Objective: General: No acute distress, AAOx3  DP/PT pulses palpable 2/4, CRT < 3 sec to all digits.  Protective sensation intact. Motor function intact.  Left foot: Incision is well coapted without any evidence of dehiscence with sutures intact. There is no surrounding erythema, ascending cellulitis, fluctuance, crepitus, malodor, drainage/purulence. There is mild edema around the surgical site.  Her swelling actually is improved compared to what it was prior to surgery.  There is mild pain along the surgical site.  No other areas of tenderness to bilateral lower extremities.  No other open lesions or pre-ulcerative lesions.  No pain with calf compression, swelling, warmth, erythema.   Assessment and Plan:  Status post left foot surgery, doing well with no complications   -Treatment options discussed including all alternatives, risks, and complications -Overall she is doing better.  Antibiotic ointment and a bandage was applied followed by an Ace bandage.  Continue to keep the pain is clean, dry, intact. -Continue cam boot.  She can transition to weightbearing as tolerable.  She states that she is to go home and try to find a Hassell Done that got into her hen house and I recommended her not to do this and to stay off of her foot as much as possible. -Ice/elevation -Pain medication as needed. -Monitor for any clinical signs or symptoms of infection and DVT/PE and directed to call the office immediately  should any occur or go to the ER. -Follow-up in 10 days for possible suture removal or sooner if any problems arise. In the meantime, encouraged to call the office with any questions, concerns, change in symptoms.   Celesta Gentile, DPM

## 2018-01-19 ENCOUNTER — Encounter: Payer: Self-pay | Admitting: Podiatry

## 2018-01-19 ENCOUNTER — Ambulatory Visit (INDEPENDENT_AMBULATORY_CARE_PROVIDER_SITE_OTHER): Payer: BLUE CROSS/BLUE SHIELD | Admitting: Podiatry

## 2018-01-19 DIAGNOSIS — M722 Plantar fascial fibromatosis: Secondary | ICD-10-CM | POA: Insufficient documentation

## 2018-01-19 DIAGNOSIS — M779 Enthesopathy, unspecified: Secondary | ICD-10-CM

## 2018-01-19 NOTE — Progress Notes (Signed)
Subjective: Sonya Mason is a 42 y.o. is seen today in office s/p left foot endoscopic plantar fasciotomy with PRP injection preformed on 01/04/2018.  Overall she states that she is doing well.  She has gone some time without the cam boot and she states the pain is overall getting better and feels better than it did before surgery.  Otherwise she is walking the cam boot at all times.  She is not taking any pain medicine.  She still trying to ice the area.  She has no other concerns.  No recent injury. Denies any systemic complaints such as fevers, chills, nausea, vomiting. No calf pain, chest pain, shortness of breath.   Objective: General: No acute distress, AAOx3  DP/PT pulses palpable 2/4, CRT < 3 sec to all digits.  Protective sensation intact. Motor function intact.  Left foot: Incision is well coapted without any evidence of dehiscence with single sutures intact to both incisions. There is no surrounding erythema, ascending cellulitis, fluctuance, crepitus, malodor, drainage/purulence. There is minimal and improved edema around the surgical site.   There is minimal tenderness palpation of the plantar medial tubercle of the calcaneus at the insertion of plantar fashion overall appears to be much improved compared to preoperatively.  No pain in the cuboid along the peroneal tendons.  Overall tendons appear to be intact. No other areas of tenderness to bilateral lower extremities.  No other open lesions or pre-ulcerative lesions.  No pain with calf compression, swelling, warmth, erythema.   Assessment and Plan:  Status post left foot surgery, doing well with no complications   -Treatment options discussed including all alternatives, risks, and complications -Overall she is getting better.  I removed the remainder of the sutures today without any complications followed by antibiotic ointment and a Band-Aid.  She can continue with daily dressing changes.  Continue with compression.  Continue  cam boot for now but how she is doing well she can start to transition to regular shoe we discussed wearing a more supportive shoe not to go barefoot or wear flat sandal or house slipper.  Return in about 3 weeks (around 02/09/2018).  Trula Slade DPM

## 2018-02-07 ENCOUNTER — Ambulatory Visit (INDEPENDENT_AMBULATORY_CARE_PROVIDER_SITE_OTHER): Payer: BLUE CROSS/BLUE SHIELD | Admitting: Podiatry

## 2018-02-07 ENCOUNTER — Encounter: Payer: Self-pay | Admitting: Podiatry

## 2018-02-07 ENCOUNTER — Ambulatory Visit (INDEPENDENT_AMBULATORY_CARE_PROVIDER_SITE_OTHER): Payer: BLUE CROSS/BLUE SHIELD

## 2018-02-07 DIAGNOSIS — M722 Plantar fascial fibromatosis: Secondary | ICD-10-CM

## 2018-02-07 DIAGNOSIS — S92215A Nondisplaced fracture of cuboid bone of left foot, initial encounter for closed fracture: Secondary | ICD-10-CM | POA: Diagnosis not present

## 2018-02-07 DIAGNOSIS — S92215K Nondisplaced fracture of cuboid bone of left foot, subsequent encounter for fracture with nonunion: Secondary | ICD-10-CM

## 2018-02-07 DIAGNOSIS — M779 Enthesopathy, unspecified: Secondary | ICD-10-CM

## 2018-02-13 NOTE — Progress Notes (Addendum)
Subjective: Sonya Mason is a 42 y.o. is seen today in office s/p left foot endoscopic plantar fasciotomy with PRP injection preformed on 01/04/2018.  She states from what the surgery was done she is doing much better.  She states that she is been active and she has not been wearing the boot and yesterday she did have some swelling to the outside aspect of her foot she thinks that with the fractures that still causing issues otherwise she is doing better. This dates back to April 2019 when she jumped and injured her left foot. Denies any systemic complaints such as fevers, chills, nausea, vomiting. No calf pain, chest pain, shortness of breath.   Objective: General: No acute distress, AAOx3  DP/PT pulses palpable 2/4, CRT < 3 sec to all digits.  Protective sensation intact. Motor function intact.  Left foot: Incision is well coapted without any evidence of dehiscence and scars are formed.  There is decreased tenderness palpation on the plantar fashion overall this appears to be intact.  No significant discomfort to the peroneal tendon.  There is still some discomfort along the cuboid.  There is mild swelling to the area today but she does report that she has intermittent swelling and over the weekend she did have more swelling.  No recent injury or falls. No other areas of tenderness to bilateral lower extremities.  No other open lesions or pre-ulcerative lesions.  No pain with calf compression, swelling, warmth, erythema.   Assessment and Plan:  Status post left foot surgery, doing well with no complications   -Treatment options discussed including all alternatives, risks, and complications -X-ray was obtained reviewed.  Difficult to evaluate the cuboid fracture but there is still radiolucent area in the inferior portion of the cuboid consistent with nonunion with a fracture gap of approximately 39mm.  Also she does continue to have symptoms to this area.  Because this will order a bone  stimulator given nonhealing of the fracture. I contacted Donley Redder about this.  -From the plantar fascia, surgical standpoint she is doing much better.  However given the cuboid fracture want her to remain in the cam boot.  She is very active and I think that she needs to be in the boot to continue mobilization to facilitate healing. -Continue ice elevate.  Trula Slade DPM

## 2018-02-20 DIAGNOSIS — E109 Type 1 diabetes mellitus without complications: Secondary | ICD-10-CM | POA: Diagnosis not present

## 2018-02-28 ENCOUNTER — Encounter: Payer: BLUE CROSS/BLUE SHIELD | Admitting: Podiatry

## 2018-03-06 ENCOUNTER — Telehealth: Payer: Self-pay | Admitting: Podiatry

## 2018-03-06 NOTE — Telephone Encounter (Signed)
I called to schedule a peer to peer for her bone stimulator on Thursday 9/25 and it was scheduled for 4pm on 03/02/2018. I never heard from the company. Will try again.

## 2018-03-23 ENCOUNTER — Ambulatory Visit (INDEPENDENT_AMBULATORY_CARE_PROVIDER_SITE_OTHER): Payer: BLUE CROSS/BLUE SHIELD

## 2018-03-23 ENCOUNTER — Ambulatory Visit (INDEPENDENT_AMBULATORY_CARE_PROVIDER_SITE_OTHER): Payer: BLUE CROSS/BLUE SHIELD | Admitting: Podiatry

## 2018-03-23 DIAGNOSIS — S92215K Nondisplaced fracture of cuboid bone of left foot, subsequent encounter for fracture with nonunion: Secondary | ICD-10-CM | POA: Diagnosis not present

## 2018-03-23 DIAGNOSIS — R609 Edema, unspecified: Secondary | ICD-10-CM | POA: Diagnosis not present

## 2018-03-24 ENCOUNTER — Other Ambulatory Visit: Payer: Self-pay | Admitting: Podiatry

## 2018-03-24 DIAGNOSIS — R609 Edema, unspecified: Secondary | ICD-10-CM

## 2018-03-27 NOTE — Progress Notes (Signed)
Subjective: 42 year old female presents the office today for concerns of swelling and pain to the also aspect of her foot.  She states that the plantar fascia surgery has done well she has very minimal pain in this area as well as the ankle and the only area of tenderness is the outside aspect of the foot and it does continue to swell.  She denies any recent injury or trauma.  Unfortunately were not able to get a bone sooner due to insurance not approving this. Denies any systemic complaints such as fevers, chills, nausea, vomiting. No acute changes since last appointment, and no other complaints at this time.   Objective: AAO x3, NAD DP/PT pulses palpable bilaterally, CRT less than 3 seconds There is minimal to no discomfort on the plantar fashion on the surgical site of this area.  There is no pain over the course the peroneal tendon.  The only area of discomfort is along the lateral aspect but this is directly along the cuboid and there is swelling to this area.  No other areas of pinpoint bony tenderness identified today.  Flexor, extensor, Achilles tendon appear to be intact. No open lesions or pre-ulcerative lesions.  No pain with calf compression, swelling, warmth, erythema  Assessment: Cuboid fracture with nonunion left foot  Plan: -All treatment options discussed with the patient including all alternatives, risks, complications.  -Repeat x-rays were obtained reviewed unable to visualize a radiolucent line within the cuboid consistent with a fracture. -Given the continuation of pain I would continue to try for a bone stimulator given the nonunion of the cuboid fracture.  Although she had surgery this was not because of the fracture and this was because of the plantar fasciitis tear as well as the peroneal tendinitis. -Continue recommend immobilization in a cam boot.  Unfortunately she is very active and she cannot wear this all the time. -Patient encouraged to call the office with any  questions, concerns, change in symptoms.   Trula Slade DPM

## 2018-04-13 ENCOUNTER — Ambulatory Visit: Payer: BLUE CROSS/BLUE SHIELD | Admitting: Podiatry

## 2018-04-19 DIAGNOSIS — Z1231 Encounter for screening mammogram for malignant neoplasm of breast: Secondary | ICD-10-CM | POA: Diagnosis not present

## 2018-04-19 DIAGNOSIS — Z01419 Encounter for gynecological examination (general) (routine) without abnormal findings: Secondary | ICD-10-CM | POA: Diagnosis not present

## 2018-04-19 DIAGNOSIS — Z1151 Encounter for screening for human papillomavirus (HPV): Secondary | ICD-10-CM | POA: Diagnosis not present

## 2018-04-19 DIAGNOSIS — R87611 Atypical squamous cells cannot exclude high grade squamous intraepithelial lesion on cytologic smear of cervix (ASC-H): Secondary | ICD-10-CM | POA: Diagnosis not present

## 2018-04-19 DIAGNOSIS — Z6836 Body mass index (BMI) 36.0-36.9, adult: Secondary | ICD-10-CM | POA: Diagnosis not present

## 2018-05-05 DIAGNOSIS — E109 Type 1 diabetes mellitus without complications: Secondary | ICD-10-CM | POA: Diagnosis not present

## 2018-05-09 ENCOUNTER — Encounter: Payer: Self-pay | Admitting: Podiatry

## 2018-05-09 DIAGNOSIS — M654 Radial styloid tenosynovitis [de Quervain]: Secondary | ICD-10-CM | POA: Diagnosis not present

## 2018-05-09 NOTE — Progress Notes (Signed)
Patients medical records were placed up front to be mailed to patient per her request. °

## 2018-06-06 DIAGNOSIS — R87613 High grade squamous intraepithelial lesion on cytologic smear of cervix (HGSIL): Secondary | ICD-10-CM | POA: Diagnosis not present

## 2018-06-12 DIAGNOSIS — E049 Nontoxic goiter, unspecified: Secondary | ICD-10-CM | POA: Diagnosis not present

## 2018-06-12 DIAGNOSIS — Z9641 Presence of insulin pump (external) (internal): Secondary | ICD-10-CM | POA: Diagnosis not present

## 2018-06-12 DIAGNOSIS — E039 Hypothyroidism, unspecified: Secondary | ICD-10-CM | POA: Diagnosis not present

## 2018-06-12 DIAGNOSIS — E109 Type 1 diabetes mellitus without complications: Secondary | ICD-10-CM | POA: Diagnosis not present

## 2018-07-05 DIAGNOSIS — D069 Carcinoma in situ of cervix, unspecified: Secondary | ICD-10-CM | POA: Diagnosis not present

## 2018-07-05 DIAGNOSIS — R87613 High grade squamous intraepithelial lesion on cytologic smear of cervix (HGSIL): Secondary | ICD-10-CM | POA: Diagnosis not present

## 2018-07-18 DIAGNOSIS — R87613 High grade squamous intraepithelial lesion on cytologic smear of cervix (HGSIL): Secondary | ICD-10-CM | POA: Diagnosis not present

## 2018-07-18 HISTORY — PX: CERVICAL BIOPSY  W/ LOOP ELECTRODE EXCISION: SUR135

## 2018-08-08 ENCOUNTER — Encounter: Payer: Self-pay | Admitting: Obstetrics & Gynecology

## 2018-08-08 ENCOUNTER — Ambulatory Visit: Payer: BLUE CROSS/BLUE SHIELD | Admitting: Obstetrics & Gynecology

## 2018-08-08 VITALS — BP 128/76 | Ht 66.25 in | Wt 230.0 lb

## 2018-08-08 DIAGNOSIS — Z9889 Other specified postprocedural states: Secondary | ICD-10-CM | POA: Diagnosis not present

## 2018-08-08 DIAGNOSIS — R102 Pelvic and perineal pain: Secondary | ICD-10-CM

## 2018-08-08 DIAGNOSIS — Z113 Encounter for screening for infections with a predominantly sexual mode of transmission: Secondary | ICD-10-CM | POA: Diagnosis not present

## 2018-08-08 LAB — WET PREP FOR TRICH, YEAST, CLUE

## 2018-08-08 NOTE — Progress Notes (Signed)
    Sonya Mason 09-12-1975 [id number]        42 y.o.  G2P0011 Married.  Vasectomy.  RP: Frequent pelvic cramps x 2 years  HPI: Menstrual periods every month with light flow 1-4 days.  No BTB.  Cramping in lower abdomen with lower back pain most days, not cyclic.  Occasionally also having right or left pelvic pain.  Deep pelvic pain with IC.  Normal vaginal secretions.  Had a LEEP in 06/2018, per patient, margins negative.  No recent STI screen.  No fever.  BMs daily, normal.  Urine normal, no UTI Sx.  DM on Insulin.   OB History  Gravida Para Term Preterm AB Living  2 1     1 1   SAB TAB Ectopic Multiple Live Births  1            # Outcome Date GA Lbr Len/2nd Weight Sex Delivery Anes PTL Lv  2 SAB           1 Para             Past medical history,surgical history, problem list, medications, allergies, family history and social history were all reviewed and documented in the EPIC chart.   Directed ROS with pertinent positives and negatives documented in the history of present illness/assessment and plan.  Exam:  Vitals:   08/08/18 1004  BP: 128/76  Weight: 230 lb (104.3 kg)  Height: 5' 6.25" (1.683 m)   General appearance:  Normal  Abdomen: Normal  Gynecologic exam: Vulva normal.  Speculum: Cervix and vagina normal.  Normal vaginal secretions.  Gonorrhea and chlamydia done.  Wet prep done.  Bimanual exam: Uterus anteverted, normal volume, mobile, nontender.  No adnexal mass felt, nontender bilaterally.  Wet prep negative   Assessment/Plan:  43 y.o. G2P0011   1. Pelvic pain in female Intermittent crampy lower abdominal and back pain for many months.  Normal gynecologic exam today.  We will follow-up for pelvic ultrasound to further investigate. - US Transvaginal Non-OB; Future  2. H/O LEEP LEEP in January 2020 with margins negative.  Will repeat Pap test at 4 months.  3. Screen for STD (sexually transmitted disease) Rule out gonorrhea and chlamydia.  Wet prep  negative. - WET PREP FOR TRICH, YEAST, CLUE - C. trachomatis/N. gonorrhoeae RNA  Other orders - levothyroxine (SYNTHROID, LEVOTHROID) 50 MCG tablet; Take 50 mcg by mouth daily before breakfast.  Counseling on above issues and coordination of care more than 50% for 25 minutes.  Princess Bruins MD, 10:50 AM 08/08/2018

## 2018-08-09 LAB — C. TRACHOMATIS/N. GONORRHOEAE RNA
C. trachomatis RNA, TMA: NOT DETECTED
N. GONORRHOEAE RNA, TMA: NOT DETECTED

## 2018-08-14 ENCOUNTER — Encounter: Payer: Self-pay | Admitting: Obstetrics & Gynecology

## 2018-08-14 NOTE — Patient Instructions (Signed)
1. Pelvic pain in female Intermittent crampy lower abdominal and back pain for many months.  Normal gynecologic exam today.  We will follow-up for pelvic ultrasound to further investigate. - US Transvaginal Non-OB; Future  2. H/O LEEP LEEP in January 2020 with margins negative.  Will repeat Pap test at 4 months.  3. Screen for STD (sexually transmitted disease) Rule out gonorrhea and chlamydia.  Wet prep negative. - WET PREP FOR TRICH, YEAST, CLUE - C. trachomatis/N. gonorrhoeae RNA  Other orders - levothyroxine (SYNTHROID, LEVOTHROID) 50 MCG tablet; Take 50 mcg by mouth daily before breakfast.  Pamala Hurry, it was a pleasure seeing you today!  I will inform you of your results as soon as they are available.

## 2018-08-31 ENCOUNTER — Ambulatory Visit (INDEPENDENT_AMBULATORY_CARE_PROVIDER_SITE_OTHER): Payer: BLUE CROSS/BLUE SHIELD

## 2018-08-31 ENCOUNTER — Encounter: Payer: Self-pay | Admitting: Obstetrics & Gynecology

## 2018-08-31 ENCOUNTER — Ambulatory Visit: Payer: BLUE CROSS/BLUE SHIELD | Admitting: Obstetrics & Gynecology

## 2018-08-31 ENCOUNTER — Other Ambulatory Visit: Payer: Self-pay

## 2018-08-31 ENCOUNTER — Other Ambulatory Visit: Payer: Self-pay | Admitting: Obstetrics & Gynecology

## 2018-08-31 VITALS — BP 118/78

## 2018-08-31 DIAGNOSIS — D251 Intramural leiomyoma of uterus: Secondary | ICD-10-CM | POA: Diagnosis not present

## 2018-08-31 DIAGNOSIS — Z9889 Other specified postprocedural states: Secondary | ICD-10-CM | POA: Diagnosis not present

## 2018-08-31 DIAGNOSIS — R102 Pelvic and perineal pain: Secondary | ICD-10-CM | POA: Diagnosis not present

## 2018-08-31 DIAGNOSIS — N711 Chronic inflammatory disease of uterus: Secondary | ICD-10-CM

## 2018-08-31 MED ORDER — CEPHALEXIN 500 MG PO CAPS: 500.0000 mg | ORAL_CAPSULE | Freq: Three times a day (TID) | ORAL | 0 refills | Status: AC

## 2018-08-31 NOTE — Progress Notes (Signed)
    Sonya Mason 1975/06/19 [id number]        43 y.o.  G2P0011 Married.  Vasectomy.  RP:  Pelvic cramping for Pelvic US  HPI:  S/P Endometrial ablation many yrs ago.  Pelvic cramping since LEEP 06/2018.  Menses regular every month.  Cramping was not cyclic, but had more menstrual flow on LMP 08/24/2018 and the cramping was more severe as well.   Gono-Chlam negative 08/08/2018.   OB History  Gravida Para Term Preterm AB Living  2 1     1 1   SAB TAB Ectopic Multiple Live Births  1            # Outcome Date GA Lbr Len/2nd Weight Sex Delivery Anes PTL Lv  2 SAB           1 Para             Past medical history,surgical history, problem list, medications, allergies, family history and social history were all reviewed and documented in the EPIC chart.   Directed ROS with pertinent positives and negatives documented in the history of present illness/assessment and plan.  Exam:  Vitals:   08/31/18 1502  BP: 118/78   General appearance:  Normal  Pelvic US today: T/V images.  Anteverted uterus measuring 10.86 x 6.24 x 5.8 cm.  Intramural fibroid measuring 2.8 x 2.2 cm mildly displacing the endometrium.  Endometrial lining normal at 5.9 mm.  Right ovary normal.  Left ovary normal.  No apparent mass in the right or left adnexa.  No free fluid in the posterior cul-de-sac.  Gono-Chlam negative on 08/08/2018   Assessment/Plan:  43 y.o. G2P0011   1. Pelvic pain in female Pelvic cramping with increased menstrual flow.  Patient is status post LEEP in January 2020 and the symptoms have started after that procedure.  Pelvic ultrasound negative except for a small intramural fibroid measuring 2.8 cm slightly displacing the endometrium.  The endometrial lining is normal at 5.9 mm.  Both ovaries are normal.  Given the timing of the symptoms and the nature of the symptoms, suspicion of a chronic endometritis.    2. Chronic endometritis Suspicion of chronic endometritis post LEEP procedure.   Decision to treat with cephalexin 500 mg 3 times a day for 7 days.  Usage reviewed with patient, low risk of cross allergy with penicillin.  Prescription sent to pharmacy.   3. H/O LEEP January 2020.  Other orders - cephALEXin (KEFLEX) 500 MG capsule; Take 1 capsule (500 mg total) by mouth 3 (three) times daily for 7 days.  Counseling on above issues and coordination of care more than 50% for 15 minutes.  Princess Bruins MD, 3:06 PM 08/31/2018

## 2018-09-01 ENCOUNTER — Encounter: Payer: Self-pay | Admitting: Obstetrics & Gynecology

## 2018-09-01 NOTE — Patient Instructions (Signed)
1. Pelvic pain in female Pelvic cramping with increased menstrual flow.  Patient is status post LEEP in January 2020 and the symptoms have started after that procedure.  Pelvic ultrasound negative except for a small intramural fibroid measuring 2.8 cm slightly displacing the endometrium.  The endometrial lining is normal at 5.9 mm.  Both ovaries are normal.  Given the timing of the symptoms and the nature of the symptoms, suspicion of a chronic endometritis.    2. Chronic endometritis Suspicion of chronic endometritis post LEEP procedure.  Decision to treat with cephalexin 500 mg 3 times a day for 7 days.  Usage reviewed with patient, low risk of cross allergy with penicillin.  Prescription sent to pharmacy.   3. H/O LEEP January 2020.  Other orders - cephALEXin (KEFLEX) 500 MG capsule; Take 1 capsule (500 mg total) by mouth 3 (three) times daily for 7 days.  Sonya Mason, it was a pleasure seeing you today!

## 2018-09-13 DIAGNOSIS — M47816 Spondylosis without myelopathy or radiculopathy, lumbar region: Secondary | ICD-10-CM | POA: Diagnosis not present

## 2018-09-13 DIAGNOSIS — M545 Low back pain: Secondary | ICD-10-CM | POA: Diagnosis not present

## 2018-10-09 ENCOUNTER — Other Ambulatory Visit: Payer: Self-pay | Admitting: Endocrinology

## 2018-10-09 DIAGNOSIS — E049 Nontoxic goiter, unspecified: Secondary | ICD-10-CM

## 2018-10-16 ENCOUNTER — Ambulatory Visit
Admission: RE | Admit: 2018-10-16 | Discharge: 2018-10-16 | Disposition: A | Discharge status: Home / Self Care | Payer: BLUE CROSS/BLUE SHIELD | Source: Ambulatory Visit | Attending: Endocrinology | Admitting: Endocrinology

## 2018-10-16 DIAGNOSIS — E049 Nontoxic goiter, unspecified: Secondary | ICD-10-CM

## 2018-10-16 DIAGNOSIS — E041 Nontoxic single thyroid nodule: Secondary | ICD-10-CM | POA: Diagnosis not present

## 2018-11-07 DIAGNOSIS — Z1151 Encounter for screening for human papillomavirus (HPV): Secondary | ICD-10-CM | POA: Diagnosis not present

## 2018-11-07 DIAGNOSIS — R87613 High grade squamous intraepithelial lesion on cytologic smear of cervix (HGSIL): Secondary | ICD-10-CM | POA: Diagnosis not present

## 2018-11-07 DIAGNOSIS — Z124 Encounter for screening for malignant neoplasm of cervix: Secondary | ICD-10-CM | POA: Diagnosis not present

## 2018-11-14 DIAGNOSIS — E039 Hypothyroidism, unspecified: Secondary | ICD-10-CM | POA: Diagnosis not present

## 2018-11-14 DIAGNOSIS — E669 Obesity, unspecified: Secondary | ICD-10-CM | POA: Diagnosis not present

## 2018-11-14 DIAGNOSIS — Z9641 Presence of insulin pump (external) (internal): Secondary | ICD-10-CM | POA: Diagnosis not present

## 2018-11-14 DIAGNOSIS — E049 Nontoxic goiter, unspecified: Secondary | ICD-10-CM | POA: Diagnosis not present

## 2018-12-20 ENCOUNTER — Other Ambulatory Visit: Payer: Self-pay

## 2018-12-20 ENCOUNTER — Other Ambulatory Visit: Payer: BLUE CROSS/BLUE SHIELD

## 2018-12-20 DIAGNOSIS — Z20822 Contact with and (suspected) exposure to covid-19: Secondary | ICD-10-CM

## 2018-12-20 DIAGNOSIS — R6889 Other general symptoms and signs: Secondary | ICD-10-CM | POA: Diagnosis not present

## 2018-12-24 LAB — NOVEL CORONAVIRUS, NAA: SARS-CoV-2, NAA: NOT DETECTED

## 2018-12-29 ENCOUNTER — Encounter: Payer: Self-pay | Admitting: Podiatry

## 2018-12-29 ENCOUNTER — Ambulatory Visit (INDEPENDENT_AMBULATORY_CARE_PROVIDER_SITE_OTHER): Payer: BC Managed Care – PPO

## 2018-12-29 ENCOUNTER — Other Ambulatory Visit: Payer: Self-pay

## 2018-12-29 ENCOUNTER — Ambulatory Visit: Payer: BLUE CROSS/BLUE SHIELD | Admitting: Podiatry

## 2018-12-29 VITALS — BP 149/84 | HR 82 | Temp 97.8°F | Resp 16

## 2018-12-29 DIAGNOSIS — L02611 Cutaneous abscess of right foot: Secondary | ICD-10-CM | POA: Diagnosis not present

## 2018-12-29 DIAGNOSIS — S90851A Superficial foreign body, right foot, initial encounter: Secondary | ICD-10-CM

## 2018-12-29 DIAGNOSIS — D2120 Benign neoplasm of connective and other soft tissue of unspecified lower limb, including hip: Secondary | ICD-10-CM

## 2018-12-29 MED ORDER — DOXYCYCLINE HYCLATE 100 MG PO TABS: 100.0000 mg | ORAL_TABLET | Freq: Two times a day (BID) | ORAL | 0 refills | Status: DC

## 2018-12-29 NOTE — Progress Notes (Signed)
  Subjective: 43 year old female presents the office today for concerns of a knot on the right foot.  She had this been ongoing for about 10 days with a knot started forming over the weekend.  She said there is very painful pressure.  No radiation Denies any systemic complaints such as fevers, chills, nausea, vomiting. No acute changes since last appointment, and no other complaints at this time.   Objective: AAO x3, NAD DP/PT pulses palpable bilaterally, CRT less than 3 seconds On the right foot more on the medial aspect of the arch area is what appears to be a puncture wound.  Is able to complete this today and some amount of superficial purulence was expressed and a small metallic foreign body was identified.  I was able to remove this and there is no deeper foreign body identified.  Minimal edema to the area and faint erythema but no ascending cellulitis.  No fluctuation potation about the No open lesions or pre-ulcerative lesions.  No pain with calf compression, swelling, warmth, erythema  Assessment: Foreign body right foot with superficial  Plan: -All treatment options discussed with the patient including all alternatives, risks, complications.  -X-rays were obtained reviewed.  Skin marker was utilized identify the area of the skin lesion.  No foreign body identified. -Is able to debride the lesion today after cleaned with alcohol.  Small moderate purulence was identified and a small metallic foreign body was identified this was removed.  I further inspected the area no deeper signs of infection or foreign body was identified.  There is clear with alcohol.  Antibiotic ointment and a bandage was applied.  Prescribed doxycycline.  Offloading pads.  We will see him to call the office.  There is no improvement next couple days on order ultrasound to rule out foreign body or abscess which may be deeper. -Patient encouraged to call the office with any questions, concerns, change in symptoms.    Trula Slade DPM

## 2019-01-02 ENCOUNTER — Other Ambulatory Visit: Payer: Self-pay | Admitting: Podiatry

## 2019-01-02 DIAGNOSIS — S90851A Superficial foreign body, right foot, initial encounter: Secondary | ICD-10-CM

## 2019-03-30 DIAGNOSIS — R87613 High grade squamous intraepithelial lesion on cytologic smear of cervix (HGSIL): Secondary | ICD-10-CM | POA: Diagnosis not present

## 2019-03-30 DIAGNOSIS — Z01419 Encounter for gynecological examination (general) (routine) without abnormal findings: Secondary | ICD-10-CM | POA: Diagnosis not present

## 2019-03-30 DIAGNOSIS — Z124 Encounter for screening for malignant neoplasm of cervix: Secondary | ICD-10-CM | POA: Diagnosis not present

## 2019-04-18 ENCOUNTER — Other Ambulatory Visit: Payer: Self-pay

## 2019-04-18 DIAGNOSIS — Z20822 Contact with and (suspected) exposure to covid-19: Secondary | ICD-10-CM

## 2019-04-20 ENCOUNTER — Telehealth: Payer: Self-pay | Admitting: Endocrinology

## 2019-04-20 LAB — NOVEL CORONAVIRUS, NAA: SARS-CoV-2, NAA: NOT DETECTED

## 2019-04-20 NOTE — Telephone Encounter (Signed)
Negative COVID results given. Patient results "NOT Detected." Caller expressed understanding. ° °

## 2019-05-17 ENCOUNTER — Other Ambulatory Visit: Payer: Self-pay

## 2019-05-17 DIAGNOSIS — Z20822 Contact with and (suspected) exposure to covid-19: Secondary | ICD-10-CM

## 2019-05-18 LAB — NOVEL CORONAVIRUS, NAA: SARS-CoV-2, NAA: DETECTED — AB

## 2019-05-25 ENCOUNTER — Other Ambulatory Visit: Payer: Self-pay | Admitting: Endocrinology

## 2019-05-25 DIAGNOSIS — E049 Nontoxic goiter, unspecified: Secondary | ICD-10-CM

## 2019-06-22 ENCOUNTER — Other Ambulatory Visit: Payer: Self-pay

## 2019-06-22 ENCOUNTER — Ambulatory Visit
Admission: RE | Admit: 2019-06-22 | Discharge: 2019-06-22 | Disposition: A | Discharge status: Home / Self Care | Payer: BC Managed Care – PPO | Source: Ambulatory Visit | Attending: Endocrinology | Admitting: Endocrinology

## 2019-06-22 ENCOUNTER — Other Ambulatory Visit: Payer: BC Managed Care – PPO

## 2019-06-22 DIAGNOSIS — E049 Nontoxic goiter, unspecified: Secondary | ICD-10-CM

## 2019-06-22 DIAGNOSIS — E041 Nontoxic single thyroid nodule: Secondary | ICD-10-CM | POA: Diagnosis not present

## 2019-08-14 ENCOUNTER — Other Ambulatory Visit: Payer: Self-pay

## 2019-08-14 ENCOUNTER — Encounter: Payer: Self-pay | Admitting: Podiatry

## 2019-08-14 ENCOUNTER — Ambulatory Visit (INDEPENDENT_AMBULATORY_CARE_PROVIDER_SITE_OTHER): Payer: BC Managed Care – PPO

## 2019-08-14 ENCOUNTER — Ambulatory Visit: Payer: BC Managed Care – PPO | Admitting: Podiatry

## 2019-08-14 ENCOUNTER — Other Ambulatory Visit: Payer: Self-pay | Admitting: Podiatry

## 2019-08-14 DIAGNOSIS — M25475 Effusion, left foot: Secondary | ICD-10-CM

## 2019-08-14 DIAGNOSIS — M779 Enthesopathy, unspecified: Secondary | ICD-10-CM

## 2019-08-14 DIAGNOSIS — Z1151 Encounter for screening for human papillomavirus (HPV): Secondary | ICD-10-CM | POA: Diagnosis not present

## 2019-08-14 DIAGNOSIS — Z1231 Encounter for screening mammogram for malignant neoplasm of breast: Secondary | ICD-10-CM | POA: Diagnosis not present

## 2019-08-14 DIAGNOSIS — M19072 Primary osteoarthritis, left ankle and foot: Secondary | ICD-10-CM | POA: Diagnosis not present

## 2019-08-14 DIAGNOSIS — M778 Other enthesopathies, not elsewhere classified: Secondary | ICD-10-CM

## 2019-08-14 DIAGNOSIS — R87613 High grade squamous intraepithelial lesion on cytologic smear of cervix (HGSIL): Secondary | ICD-10-CM | POA: Diagnosis not present

## 2019-08-14 DIAGNOSIS — Z124 Encounter for screening for malignant neoplasm of cervix: Secondary | ICD-10-CM | POA: Diagnosis not present

## 2019-08-15 ENCOUNTER — Telehealth: Payer: Self-pay | Admitting: *Deleted

## 2019-08-15 DIAGNOSIS — S90851A Superficial foreign body, right foot, initial encounter: Secondary | ICD-10-CM

## 2019-08-15 DIAGNOSIS — M779 Enthesopathy, unspecified: Secondary | ICD-10-CM

## 2019-08-15 DIAGNOSIS — S92215K Nondisplaced fracture of cuboid bone of left foot, subsequent encounter for fracture with nonunion: Secondary | ICD-10-CM

## 2019-08-15 DIAGNOSIS — R609 Edema, unspecified: Secondary | ICD-10-CM

## 2019-08-15 DIAGNOSIS — M722 Plantar fascial fibromatosis: Secondary | ICD-10-CM

## 2019-08-15 NOTE — Progress Notes (Signed)
Subjective: 44 year old female presents the office with concerns of swelling and pain to her left foot.  She states that after I last saw her she did well for about 6 to 8 months but over the last 6 months she has had gradual worsening of pain and swelling to the left foot.  She denies any recent injury to her foot she denies any redness or warmth.  She said no recent treatment. Denies any systemic complaints such as fevers, chills, nausea, vomiting. No acute changes since last appointment, and no other complaints at this time.   Objective: AAO x3, NAD DP/PT pulses palpable bilaterally, CRT less than 3 seconds There is mild swelling to the left foot there is no erythema or warmth.  The majority of tenderness is along the dorsal medial aspect of midfoot.  To lesser extent there is a minimal discomfort the lateral midfoot.  Flexor, extensor tendons appear to be intact.  No significant pain to the plantar fascia.  The pain seems to be different compared to what it was when I last saw her and she states also the pain feels different.  No open lesions or pre-ulcerative lesions.  No pain with calf compression, swelling, warmth, erythema  Assessment: Arthritic changes midfoot  Plan: -All treatment options discussed with the patient including all alternatives, risks, complications.  -X-rays obtained and reviewed.  There is changes present along the medial midfoot and there is radiolucency is present on the talonavicular naviculocuneiform joint. -Although concern for Charcot issue has intact sensation.  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  For now [employer] was applied today precautions advised on the remove this and also to moderate assistance. -Ice elevation -Patient encouraged to call the office with any questions, concerns, change in symptoms.   Return for after CT scan .  Trula Slade DPM

## 2019-08-15 NOTE — Telephone Encounter (Signed)
-----   Message from Trula Slade, DPM sent at 08/14/2019  2:05 PM EST ----- Can you please order a CT scan of the left foot/ankle. Rule out Charcot; significant arthritic changes navicular cuneiform joint and TN joint.

## 2019-08-16 ENCOUNTER — Telehealth: Payer: Self-pay | Admitting: *Deleted

## 2019-08-16 NOTE — Telephone Encounter (Signed)
Per Dr Jacqualyn Posey had a peer to peer this morning and spoke with [employer] at [phone] and the procedure code was 73700 for the CT ankle left w/o contrast, and also get a CT foot left w/o contrast and the authorization number is [id number] and the valid dates are 08-16-2019 thru 02-11-2020. Sonya Mason

## 2019-08-16 NOTE — Telephone Encounter (Signed)
[facility] states one of the CTs was pre-certed and since Dr. Jacqualyn Posey ordered CT ankle and CT foot they will needed another prior authorization.

## 2019-08-23 ENCOUNTER — Telehealth: Payer: Self-pay | Admitting: *Deleted

## 2019-08-23 DIAGNOSIS — H0288B Meibomian gland dysfunction left eye, upper and lower eyelids: Secondary | ICD-10-CM | POA: Diagnosis not present

## 2019-08-23 DIAGNOSIS — E113293 Type 2 diabetes mellitus with mild nonproliferative diabetic retinopathy without macular edema, bilateral: Secondary | ICD-10-CM | POA: Diagnosis not present

## 2019-08-23 DIAGNOSIS — H0288A Meibomian gland dysfunction right eye, upper and lower eyelids: Secondary | ICD-10-CM | POA: Diagnosis not present

## 2019-08-23 NOTE — Telephone Encounter (Signed)
Pt called states she is returning a call from Lattie Haw, Dr. Leigh Aurora assistant.

## 2019-08-23 NOTE — Telephone Encounter (Signed)
[facility] states pt is scheduled for CT of foot and ankle on 08/28/2019, there is only one PA for the CTs, there should be one for the foot and one for the ankle.

## 2019-08-23 NOTE — Telephone Encounter (Signed)
Called patient yesterday and left a message. Sonya Mason

## 2019-08-23 NOTE — Telephone Encounter (Signed)
Called patient and stated that patient has an appointment with [facility] at 2:30 pm tomorrow and also I called [employer] [facility] and talked to [facility] about the procedure code 73700 and it was ok to use for the lower extremity left ankle and left foot. Lattie Haw

## 2019-08-24 ENCOUNTER — Ambulatory Visit (HOSPITAL_BASED_OUTPATIENT_CLINIC_OR_DEPARTMENT_OTHER)
Admission: RE | Admit: 2019-08-24 | Discharge: 2019-08-24 | Disposition: A | Discharge status: Home / Self Care | Payer: BC Managed Care – PPO | Source: Ambulatory Visit | Attending: Podiatry | Admitting: Podiatry

## 2019-08-24 ENCOUNTER — Other Ambulatory Visit: Payer: Self-pay

## 2019-08-24 DIAGNOSIS — M722 Plantar fascial fibromatosis: Secondary | ICD-10-CM

## 2019-08-24 DIAGNOSIS — S92215K Nondisplaced fracture of cuboid bone of left foot, subsequent encounter for fracture with nonunion: Secondary | ICD-10-CM | POA: Diagnosis not present

## 2019-08-24 DIAGNOSIS — M779 Enthesopathy, unspecified: Secondary | ICD-10-CM | POA: Diagnosis not present

## 2019-08-24 DIAGNOSIS — R609 Edema, unspecified: Secondary | ICD-10-CM

## 2019-08-24 DIAGNOSIS — M79672 Pain in left foot: Secondary | ICD-10-CM | POA: Diagnosis not present

## 2019-08-28 ENCOUNTER — Other Ambulatory Visit: Payer: BC Managed Care – PPO

## 2019-08-29 ENCOUNTER — Telehealth: Payer: Self-pay | Admitting: *Deleted

## 2019-08-29 NOTE — Telephone Encounter (Signed)
Patient is coming tomorrow afternoon to get the unna boot put back on. Sonya Mason

## 2019-08-29 NOTE — Telephone Encounter (Signed)
-----   Message from Trula Slade, DPM sent at 08/27/2019  5:53 PM EDT ----- Can you just put on an unna boot this week? I told her you would call her.

## 2019-08-30 ENCOUNTER — Ambulatory Visit (INDEPENDENT_AMBULATORY_CARE_PROVIDER_SITE_OTHER): Payer: BC Managed Care – PPO | Admitting: Podiatry

## 2019-08-30 ENCOUNTER — Other Ambulatory Visit: Payer: Self-pay

## 2019-08-30 ENCOUNTER — Encounter: Payer: Self-pay | Admitting: Podiatry

## 2019-08-30 DIAGNOSIS — R2242 Localized swelling, mass and lump, left lower limb: Secondary | ICD-10-CM | POA: Diagnosis not present

## 2019-09-03 NOTE — Progress Notes (Signed)
Patient presents for an Haematologist on the nurse schedule.  She states that after the last unna boot her foot felt much better. She states that she had to run and chase her dog.  Since then she has had some pain.  Denies any falls.  Describes throbbing.  It was applied to help with swelling and remain in cam boot.  Limit activity.  I will see her back next week and to repeat x-rays.  Discussed there is any increasing pain, redness, or swelling to let me know.  Trula Slade DPM

## 2019-09-13 ENCOUNTER — Other Ambulatory Visit: Payer: Self-pay

## 2019-09-13 ENCOUNTER — Ambulatory Visit: Payer: BC Managed Care – PPO | Admitting: Podiatry

## 2019-09-13 ENCOUNTER — Ambulatory Visit (INDEPENDENT_AMBULATORY_CARE_PROVIDER_SITE_OTHER): Payer: BC Managed Care – PPO

## 2019-09-13 DIAGNOSIS — M14672 Charcot's joint, left ankle and foot: Secondary | ICD-10-CM

## 2019-09-13 DIAGNOSIS — R2242 Localized swelling, mass and lump, left lower limb: Secondary | ICD-10-CM | POA: Diagnosis not present

## 2019-09-14 NOTE — Progress Notes (Signed)
Subjective: 44 year old female presents the office for follow-up evaluation of left foot pain, Charcot.  She states that the [employer] are helpful.  Also originally the cam boot was helpful but she states it has not been as helpful recently.  She is on her feet quite a bit.  She denies any recent injury or falls. Denies any systemic complaints such as fevers, chills, nausea, vomiting. No acute changes since last appointment, and no other complaints at this time.   Objective: AAO x3, NAD DP/PT pulses palpable bilaterally, CRT less than 3 seconds There is mild swelling still evident to the left foot there is no erythema or warmth.  Majority tenderness is to the medial aspect of the midfoot.  Flexor, extensor tendons appear to be intact.  No open lesions identified. No pain with calf compression, swelling, warmth, erythema  Assessment: Arthritic changes midfoot  Plan: -All treatment options discussed with the patient including all alternatives, risks, complications.  -X-rays obtained and reviewed.  There is stable changes present to the medial aspect of the midfoot.  Lucencies present in the navicular which are unchanged.  Findings consistent with Charcot -Given her x-rays as well as stopping swelling to the area on her continue immobilization.  Ideally would do a cast she cannot do this.  We will continue the cam boot.  A new Unna boot was applied today precautions were advised on remove this.  Also once the Unna boot comes off to return to using her compression sock. -Discussed long-term possible bracing.  Next appointment will repeat x-rays and I will also have her follow-up with [facility] for possible brace casting.   Trula Slade DPM

## 2019-10-04 ENCOUNTER — Ambulatory Visit (INDEPENDENT_AMBULATORY_CARE_PROVIDER_SITE_OTHER): Payer: BC Managed Care – PPO | Admitting: Podiatry

## 2019-10-04 ENCOUNTER — Ambulatory Visit: Payer: BC Managed Care – PPO | Admitting: Orthotics

## 2019-10-04 ENCOUNTER — Other Ambulatory Visit: Payer: Self-pay

## 2019-10-04 ENCOUNTER — Ambulatory Visit (INDEPENDENT_AMBULATORY_CARE_PROVIDER_SITE_OTHER): Payer: BC Managed Care – PPO

## 2019-10-04 ENCOUNTER — Encounter: Payer: Self-pay | Admitting: Podiatry

## 2019-10-04 DIAGNOSIS — M19072 Primary osteoarthritis, left ankle and foot: Secondary | ICD-10-CM

## 2019-10-04 DIAGNOSIS — M14672 Charcot's joint, left ankle and foot: Secondary | ICD-10-CM | POA: Diagnosis not present

## 2019-10-04 DIAGNOSIS — M779 Enthesopathy, unspecified: Secondary | ICD-10-CM | POA: Diagnosis not present

## 2019-10-04 NOTE — Progress Notes (Signed)
Patient presents today for evaluation/casting for mezzo brace.  Patient has hx of the following conditions: Gait instability, OA midfoot, charcot.  Brace will offer RF stability and mid foot support.

## 2019-10-05 NOTE — Progress Notes (Signed)
Subjective: 44 year old female presents the office for follow-up evaluation of left foot pain, Charcot.  She also presents today to get measured for a brace.  She has been wearing the cam boot during the day but she admits that she does not wear it at home.  She states that she has not been to wear a boot long-term.  Denies any systemic complaints such as fevers, chills, nausea, vomiting. No acute changes since last appointment, and no other complaints at this time.   Objective: AAO x3, NAD DP/PT pulses palpable bilaterally, CRT less than 3 seconds There is mild swelling still evident to the left foot there is no erythema or warmth.  Majority tenderness is to the medial aspect of the midfoot.  Flexor, extensor tendons appear to be intact.  No open lesions identified.  Overall her exam is unchanged. No pain with calf compression, swelling, warmth, erythema  Assessment: Arthritic changes midfoot/Charcot  Plan: -All treatment options discussed with the patient including all alternatives, risks, complications.  -X-rays obtained and reviewed.  There is stable changes present to the medial aspect of the midfoot. Findings consistent with Charcot without any significant collapse. -I want her to remain in the cam boot for now.  Did measure her for a Mezzo brace today.  Discussed long-term surgical intervention but overall hold off on that for now as she is currently looking for a new job. Wants to hold off on another unna boot today as well  Return in about 3 weeks (around 10/25/2019).  Trula Slade DPM

## 2019-11-14 ENCOUNTER — Other Ambulatory Visit: Payer: BC Managed Care – PPO | Admitting: Orthotics

## 2019-11-19 ENCOUNTER — Ambulatory Visit (INDEPENDENT_AMBULATORY_CARE_PROVIDER_SITE_OTHER): Payer: BC Managed Care – PPO | Admitting: Orthotics

## 2019-11-19 ENCOUNTER — Other Ambulatory Visit: Payer: Self-pay

## 2019-11-19 DIAGNOSIS — E039 Hypothyroidism, unspecified: Secondary | ICD-10-CM | POA: Diagnosis not present

## 2019-11-19 DIAGNOSIS — M19072 Primary osteoarthritis, left ankle and foot: Secondary | ICD-10-CM

## 2019-11-19 DIAGNOSIS — E049 Nontoxic goiter, unspecified: Secondary | ICD-10-CM | POA: Diagnosis not present

## 2019-11-19 DIAGNOSIS — M14672 Charcot's joint, left ankle and foot: Secondary | ICD-10-CM

## 2019-11-19 DIAGNOSIS — M779 Enthesopathy, unspecified: Secondary | ICD-10-CM

## 2019-11-19 DIAGNOSIS — E109 Type 1 diabetes mellitus without complications: Secondary | ICD-10-CM | POA: Diagnosis not present

## 2019-11-19 DIAGNOSIS — Z9641 Presence of insulin pump (external) (internal): Secondary | ICD-10-CM | POA: Diagnosis not present

## 2019-11-20 ENCOUNTER — Other Ambulatory Visit (HOSPITAL_COMMUNITY): Payer: Self-pay | Admitting: Endocrinology

## 2019-11-20 DIAGNOSIS — R590 Localized enlarged lymph nodes: Secondary | ICD-10-CM

## 2019-11-20 NOTE — Progress Notes (Signed)
Patient picked up mezzo brace to addresss charcot left foot.  Brace fit well; however, would not go into [employer] shoes she had.   Advised her to get better shoes to accommodate brace.

## 2019-12-11 ENCOUNTER — Other Ambulatory Visit (HOSPITAL_COMMUNITY): Payer: Self-pay | Admitting: Endocrinology

## 2019-12-11 DIAGNOSIS — R590 Localized enlarged lymph nodes: Secondary | ICD-10-CM

## 2019-12-19 ENCOUNTER — Other Ambulatory Visit (HOSPITAL_COMMUNITY): Payer: Self-pay | Admitting: Endocrinology

## 2019-12-19 ENCOUNTER — Inpatient Hospital Stay
Admission: RE | Admit: 2019-12-19 | Discharge: 2019-12-19 | Disposition: A | Discharge status: Home / Self Care | Payer: Self-pay | Source: Ambulatory Visit | Attending: Endocrinology | Admitting: Endocrinology

## 2019-12-19 DIAGNOSIS — R590 Localized enlarged lymph nodes: Secondary | ICD-10-CM

## 2019-12-24 DIAGNOSIS — S0501XA Injury of conjunctiva and corneal abrasion without foreign body, right eye, initial encounter: Secondary | ICD-10-CM | POA: Diagnosis not present

## 2019-12-25 ENCOUNTER — Ambulatory Visit (HOSPITAL_COMMUNITY): Payer: BC Managed Care – PPO

## 2019-12-25 ENCOUNTER — Ambulatory Visit (HOSPITAL_COMMUNITY)
Admission: RE | Admit: 2019-12-25 | Discharge: 2019-12-25 | Disposition: A | Discharge status: Home / Self Care | Payer: BC Managed Care – PPO | Source: Ambulatory Visit | Attending: Endocrinology | Admitting: Endocrinology

## 2019-12-25 ENCOUNTER — Other Ambulatory Visit: Payer: Self-pay

## 2019-12-25 DIAGNOSIS — R590 Localized enlarged lymph nodes: Secondary | ICD-10-CM | POA: Insufficient documentation

## 2019-12-25 DIAGNOSIS — N644 Mastodynia: Secondary | ICD-10-CM | POA: Diagnosis not present

## 2019-12-25 DIAGNOSIS — R922 Inconclusive mammogram: Secondary | ICD-10-CM | POA: Diagnosis not present

## 2019-12-25 DIAGNOSIS — S0501XD Injury of conjunctiva and corneal abrasion without foreign body, right eye, subsequent encounter: Secondary | ICD-10-CM | POA: Diagnosis not present

## 2019-12-27 DIAGNOSIS — S0501XD Injury of conjunctiva and corneal abrasion without foreign body, right eye, subsequent encounter: Secondary | ICD-10-CM | POA: Diagnosis not present

## 2020-02-13 DIAGNOSIS — Z20828 Contact with and (suspected) exposure to other viral communicable diseases: Secondary | ICD-10-CM | POA: Diagnosis not present

## 2020-02-16 IMAGING — MR MR ANKLE*L* W/O CM
5 series · 40 of 40 positions shown · non-contrast
Comparison: Left foot x-rays dated October 17, 2017.

CLINICAL DATA: Left ankle pain and swelling.

EXAM:
MRI OF THE LEFT ANKLE WITHOUT CONTRAST
TECHNIQUE: Multiplanar, multisequence MR imaging of the ankle was performed. No
intravenous contrast was administered.

[Series 3: pdfs axial · axial · 3.0mm · 0.47mm/px · z∈[-68,+54]mm · 9 of 35 slices shown]
[im 1/35]
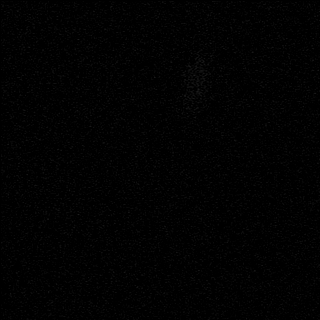
[im 5/35]
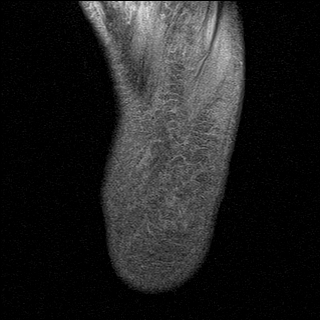
[im 9/35]
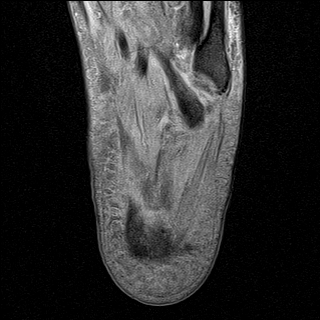
[im 13/35]
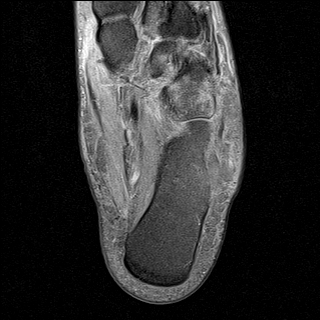
[im 18/35]
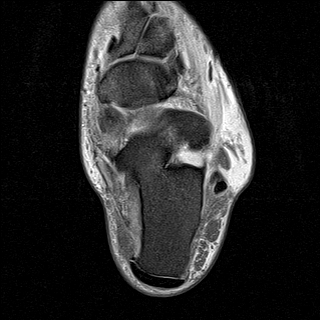
[im 22/35]
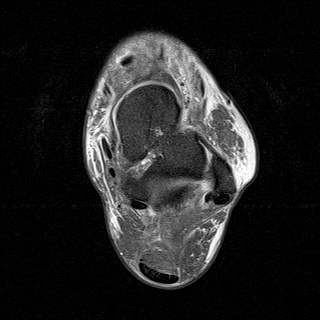
[im 26/35]
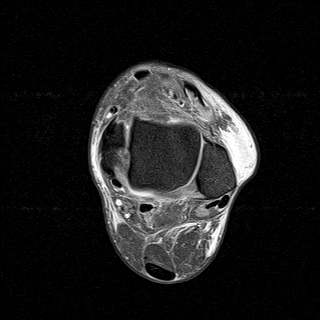
[im 30/35]
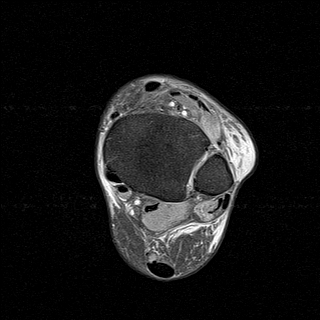
[im 35/35]
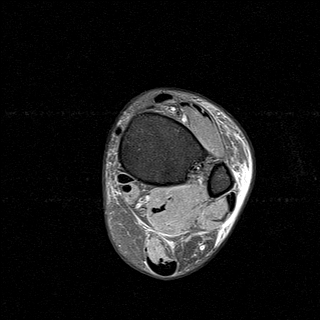

[Series 4: t2fs axial · axial · 3.0mm · 0.47mm/px · z∈[-68,+54]mm · 9 of 35 slices shown]
[im 1/35]
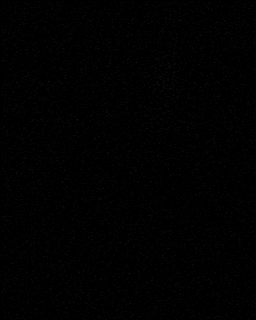
[im 5/35]
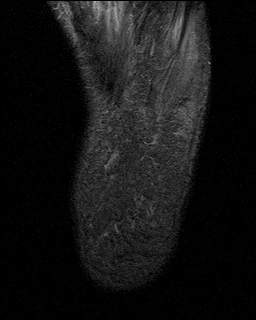
[im 9/35]
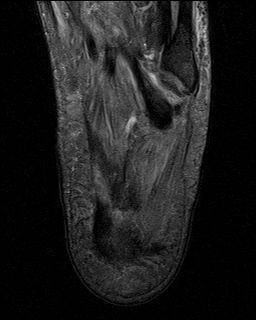
[im 13/35]
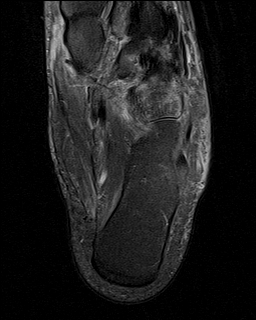
[im 18/35]
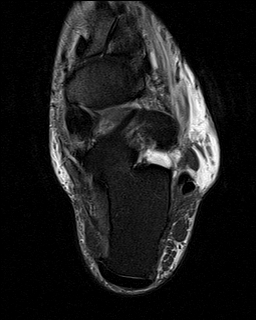
[im 22/35]
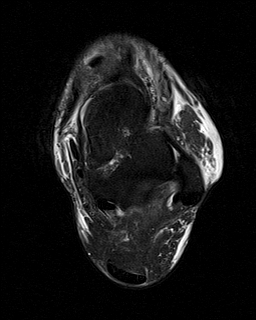
[im 26/35]
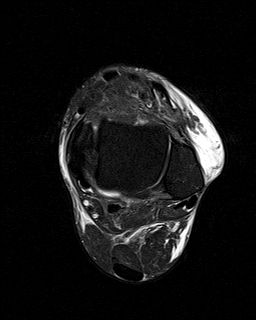
[im 30/35]
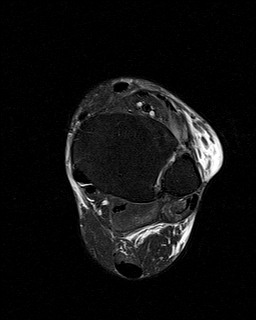
[im 35/35]
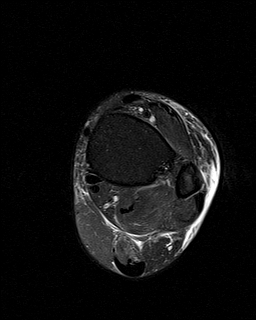

[Series 5: t2fs cor · coronal · 3.0mm · 0.55mm/px · 8 of 32 slices shown]
[im 1/32]
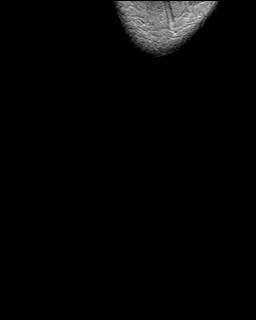
[im 5/32]
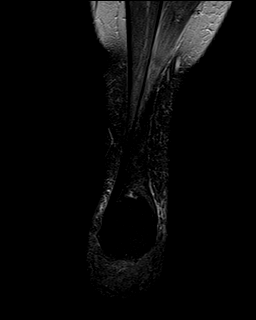
[im 9/32]
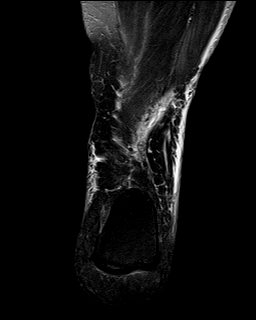
[im 14/32]
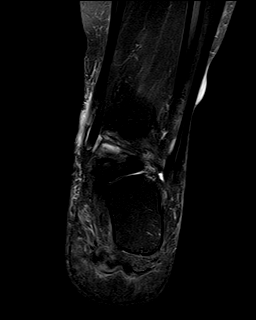
[im 18/32]
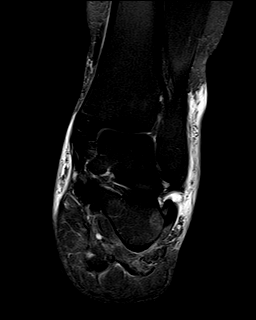
[im 23/32]
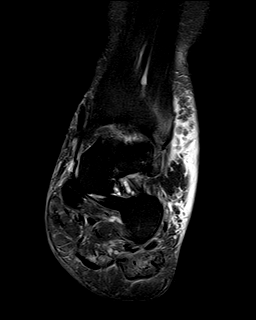
[im 27/32]
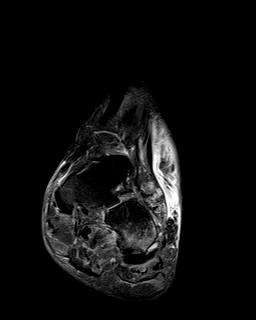
[im 32/32]
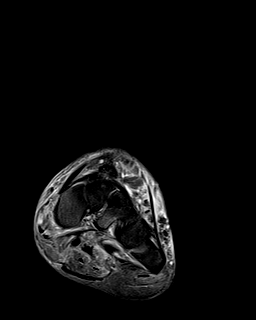

[Series 6: t2fs sag · sagittal · 3.0mm · 0.47mm/px · 7 of 25 slices shown]
[im 1/25]
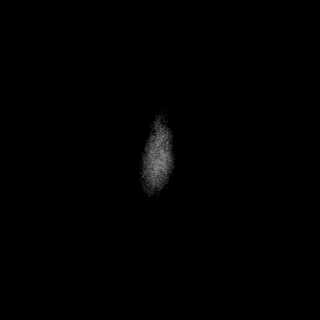
[im 5/25]
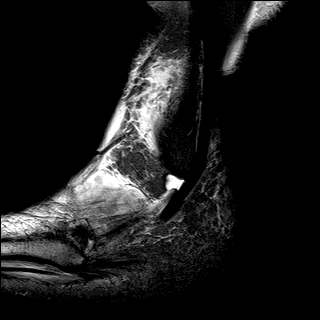
[im 9/25]
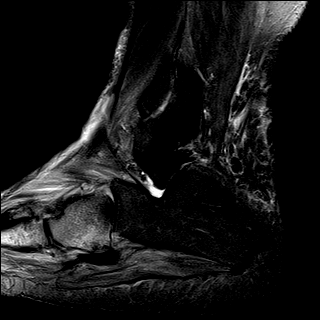
[im 13/25]
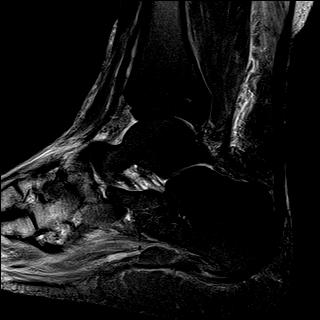
[im 17/25]
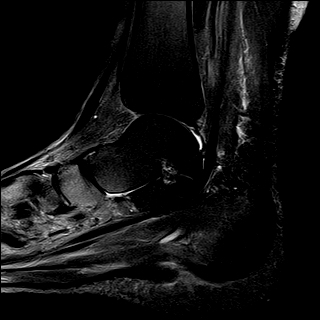
[im 21/25]
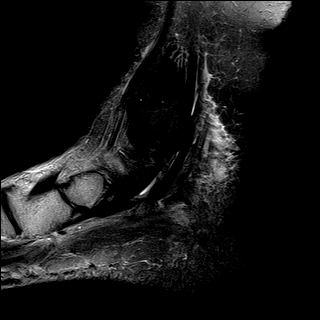
[im 25/25]
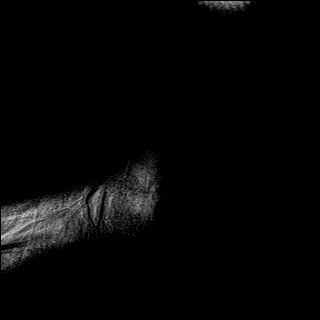

[Series 7: T1 · sagittal · 3.0mm · 0.39mm/px · 7 of 25 slices shown]
[im 1/25]
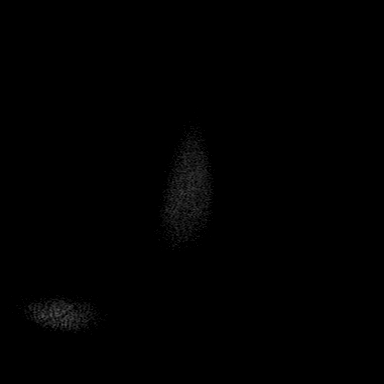
[im 5/25]
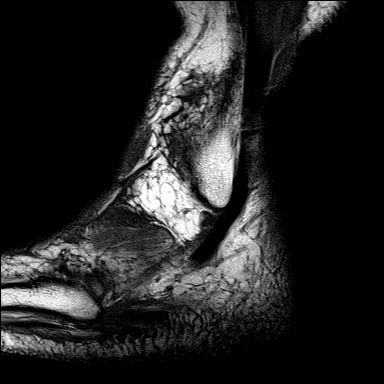
[im 9/25]
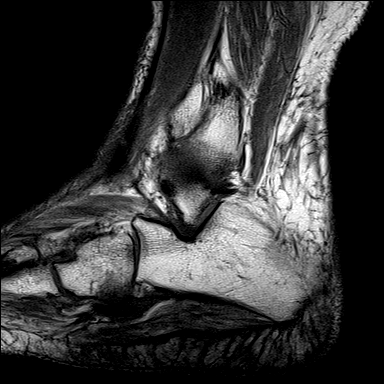
[im 13/25]
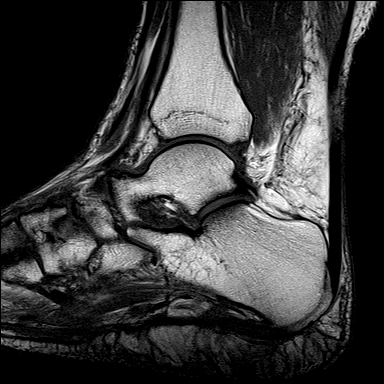
[im 17/25]
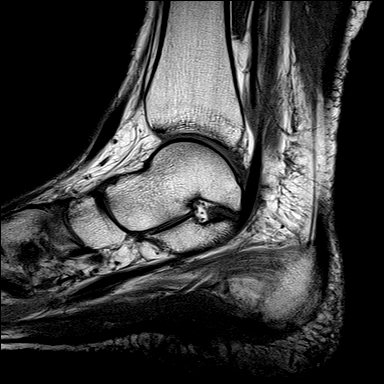
[im 21/25]
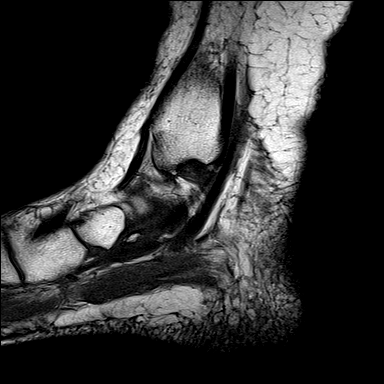
[im 25/25]
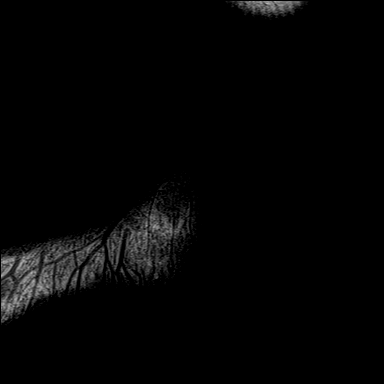

[40 of 40 positions shown; findings below may reference images not displayed]

FINDINGS: TENDONS

Peroneal: Peroneal longus tendon intact. Peroneal brevis intact.
Mild peroneal tenosynovitis.

Posteromedial: Posterior tibial tendon intact. Flexor hallucis
longus tendon intact. Flexor digitorum longus tendon intact. Mild
posterior tibialis tenosynovitis.

Anterior: Tibialis anterior tendon intact. Extensor hallucis longus
tendon intact Extensor digitorum longus tendon intact.

Achilles:  Intact.

Plantar Fascia: Marked thickening and focal disruption of the
proximal central band with surrounding soft tissue inflammatory
changes.

LIGAMENTS

Lateral: Anterior talofibular ligament intact. Calcaneofibular
ligament intact. Posterior talofibular ligament intact. Anterior and
posterior tibiofibular ligaments intact.

Medial: Deltoid ligament intact. Spring ligament intact.

CARTILAGE

Ankle Joint: No joint effusion. Normal ankle mortise. No chondral
defect.

Subtalar Joints/Sinus Tarsi: Normal subtalar joints. No subtalar
joint effusion. Increased T2 signal within the sinus tarsi.

Bones: Nondisplaced fracture of the inferior cuboid with surrounding
marrow edema. No focal bone lesion.

Soft Tissue: Moderate soft tissue swelling over the lateral
hindfoot.
IMPRESSION: 1. Nondisplaced fracture of the inferior cuboid.
2. Severe plantar fasciitis with focal complete tear of the proximal
central band.
3. Mild peroneal and posterior tibialis tenosynovitis.
4. Increased abnormal signal within the sinus tarsi. Correlate for
sinus tarsi syndrome.

## 2020-03-10 ENCOUNTER — Other Ambulatory Visit: Payer: Self-pay

## 2020-03-10 ENCOUNTER — Ambulatory Visit (INDEPENDENT_AMBULATORY_CARE_PROVIDER_SITE_OTHER): Payer: BC Managed Care – PPO | Admitting: Podiatry

## 2020-03-10 ENCOUNTER — Ambulatory Visit (INDEPENDENT_AMBULATORY_CARE_PROVIDER_SITE_OTHER): Payer: BC Managed Care – PPO

## 2020-03-10 DIAGNOSIS — M19072 Primary osteoarthritis, left ankle and foot: Secondary | ICD-10-CM

## 2020-03-10 DIAGNOSIS — M14672 Charcot's joint, left ankle and foot: Secondary | ICD-10-CM

## 2020-03-18 NOTE — Progress Notes (Signed)
Subjective: 44 year old female presents the office today for follow-up evaluation of Charcot on the left foot.  Since I last saw her painful she states that she felt better she is able to wear regular shoes and the swelling improved but she started to have increasing swelling discomfort of the foot again.  We previously made her replaced evaluation of her shoes but she is no longer wear this.  Previously recommended mobilization nonweightbearing she is back up to do this.  Denies any systemic complaints such as fevers, chills, nausea, vomiting. No acute changes since last appointment, and no other complaints at this time.   Objective: AAO x3, NAD DP/PT pulses palpable bilaterally, CRT less than 3 seconds Prominence on the plantar lateral aspect of foot.  Discomfort on the dorsal medial aspect of the midfoot on the talonavicular, naviculocuneiform joint.  There is mild edema compared to contra extremity but there is no significant warmth compared to the contralateral extremity.  Flexor, extensor tendons appear to be intact.  There are no open lesions or preulcerative calluses today.  MMT 5/5. No pain with calf compression, swelling, warmth, erythema  Assessment: Charcot left foot  Plan: -All treatment options discussed with the patient including all alternatives, risks, complications.  -X-rays obtained reviewed there is breakdown present along the navicular, navicular cuneiform joint and there is radiolucencies consistent with Charcot. -Today for a discussion regards to the Charcot with an arch progression.  Although she has not been wearing the boot or immobilization discussed with the progression of this and if this were to collapse she is looking at a more major surgery and further long-term issues.  After discussion she is to go back to the cam boot and stay off the foot.  Encourage elevation.  Discussed nonweightbearing as well and possible total contact cast.  We will see her back in 2 weeks for  repeat x-rays and she is to follow-up with Dr. Sherryle Lis as well for possible surgical consult and further evaluation.  Also concerned about possible acute Charcot so likely need to have advanced imaging as well but we will see her back in 2 weeks to see how she is doing and further evaluate. -Patient encouraged to call the office with any questions, concerns, change in symptoms.   Trula Slade DPM

## 2020-03-25 ENCOUNTER — Other Ambulatory Visit: Payer: Self-pay

## 2020-03-25 ENCOUNTER — Ambulatory Visit (INDEPENDENT_AMBULATORY_CARE_PROVIDER_SITE_OTHER): Payer: BC Managed Care – PPO

## 2020-03-25 ENCOUNTER — Ambulatory Visit: Payer: BC Managed Care – PPO | Admitting: Podiatry

## 2020-03-25 DIAGNOSIS — E10610 Type 1 diabetes mellitus with diabetic neuropathic arthropathy: Secondary | ICD-10-CM | POA: Diagnosis not present

## 2020-03-25 DIAGNOSIS — E1065 Type 1 diabetes mellitus with hyperglycemia: Secondary | ICD-10-CM | POA: Diagnosis not present

## 2020-03-25 DIAGNOSIS — E1042 Type 1 diabetes mellitus with diabetic polyneuropathy: Secondary | ICD-10-CM | POA: Diagnosis not present

## 2020-03-25 DIAGNOSIS — R2242 Localized swelling, mass and lump, left lower limb: Secondary | ICD-10-CM

## 2020-03-25 DIAGNOSIS — M14672 Charcot's joint, left ankle and foot: Secondary | ICD-10-CM

## 2020-03-26 ENCOUNTER — Encounter: Payer: Self-pay | Admitting: Podiatry

## 2020-03-26 NOTE — Progress Notes (Signed)
Subjective:  Patient ID: Sonya Mason, female    DOB: 12/13/1975,  MRN: [id number]  Chief Complaint  Patient presents with  . Foot Pain    surgery consult     44 y.o. female presents with the above complaint. History confirmed with patient.  She is referred to me by Dr. Jacqualyn Posey for second pending regarding her Charcot arthropathy of the left foot and surgical evaluation.  She has type 1 diabetes.  She states her A1c is in the high 7%, it has become as high as 12% in the past.  She thinks this may have started about a year and a half ago when she jumped out of [place] and strained her foot.  She feels like this Charcot episode has begun since April of this year.  This is her first bout with Charcot.  Objective:  Physical Exam: warm, good capillary refill, no trophic changes or ulcerative lesions, normal DP and PT pulses and absent protective sensation to monofilament on the distal pulps of toes and plantar ball of foot, this is intact approximately the level of the ankle. Left Foot: There is warmth and edema around the dorsal midfoot compared to the right, pes planus is noted with collapsed medial arch, no ulceration or preulcerative callus noted currently no immediately palpable bony prominences  Radiographs: X-ray of left foot: 3 weightbearing views of the left foot are taken there is fragmentation of the naviculocuneiform joint, the calcaneal pitch is decreased in the talus is in a documented position with a break in Meary's angle with elevatus of the first ray.  Cuboid height and navicular height appear similar to previous x-rays dated 03/10/2020 Assessment:   1. Charcot's joint arthropathy in type 1 diabetes mellitus ([place])   2. Localized swelling of left foot   3. Uncontrolled type 1 diabetes mellitus with hyperglycemia ([place])   4. DM type 1 with diabetic peripheral neuropathy ([place])      Plan:  Patient was evaluated and treated and all questions answered.  -I discussed with her  the etiology and treatment options in detail of Charcot arthropathy.  I discussed with her how this relates to her diabetic peripheral neuropathy as well as her uncontrolled diabetes in both the degrees of how Charcot arthropathy begins and evolves.  I discussed with her how progression of the disease contributes to collapse of the joint and the arch and that she has midfoot Charcot which is most likely to lead to a plantar midfoot ulceration and rocker-bottom foot deformity  Currently she appears to be still in stage I of Charcot with fragmentation, there does appear to be some increased coalescence since the previous radiographs.  She needs to maintain either complete nonweightbearing or weightbearing in a total contact cast.  I recommended total contact casting and we attempted to apply a 4 inch total contact cast, however she did not tolerate this as it did not fit her well.  We will consider trying a 3 inch in the future after we can order them.  In the meantime I recommend that she be nonweightbearing in the CAM boot.  I also discussed with her that this is order is a serious condition and has high risk of increased morbidity and mortality including a near 50% amputation rate among patients with uncontrolled diabetes and ulceration leading to infection.  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  While I appreciate  and understand that her occupation and lifestyle and social duration demand significant work on her left lower extremity, I am concerned that not preventing this deformity from progressing could lead to amputation for her.  I will see her back in 2 weeks for a new cast application and radiographs at that visit  Return in about 2 weeks (around 04/08/2020) for cast change.

## 2020-04-09 ENCOUNTER — Ambulatory Visit: Payer: BC Managed Care – PPO | Admitting: Podiatry

## 2020-04-14 DIAGNOSIS — H0288A Meibomian gland dysfunction right eye, upper and lower eyelids: Secondary | ICD-10-CM | POA: Diagnosis not present

## 2020-04-14 DIAGNOSIS — H0288B Meibomian gland dysfunction left eye, upper and lower eyelids: Secondary | ICD-10-CM | POA: Diagnosis not present

## 2020-04-14 DIAGNOSIS — S0501XD Injury of conjunctiva and corneal abrasion without foreign body, right eye, subsequent encounter: Secondary | ICD-10-CM | POA: Diagnosis not present

## 2020-04-15 ENCOUNTER — Ambulatory Visit: Payer: BC Managed Care – PPO | Admitting: Podiatry

## 2020-04-23 ENCOUNTER — Other Ambulatory Visit: Payer: Self-pay

## 2020-04-23 ENCOUNTER — Ambulatory Visit: Payer: BC Managed Care – PPO | Admitting: Podiatry

## 2020-04-23 ENCOUNTER — Encounter: Payer: Self-pay | Admitting: Podiatry

## 2020-04-23 ENCOUNTER — Ambulatory Visit (INDEPENDENT_AMBULATORY_CARE_PROVIDER_SITE_OTHER): Payer: BC Managed Care – PPO

## 2020-04-23 DIAGNOSIS — E10610 Type 1 diabetes mellitus with diabetic neuropathic arthropathy: Secondary | ICD-10-CM | POA: Diagnosis not present

## 2020-04-23 DIAGNOSIS — E1065 Type 1 diabetes mellitus with hyperglycemia: Secondary | ICD-10-CM

## 2020-04-23 DIAGNOSIS — R2242 Localized swelling, mass and lump, left lower limb: Secondary | ICD-10-CM | POA: Diagnosis not present

## 2020-04-23 DIAGNOSIS — E1042 Type 1 diabetes mellitus with diabetic polyneuropathy: Secondary | ICD-10-CM

## 2020-04-23 NOTE — Progress Notes (Signed)
  Subjective:  Patient ID: Sonya Mason, female    DOB: August 05, 1975,  MRN: [id number]  No chief complaint on file.   44 y.o. female returns for evaluation of Charcot arthropathy of the left foot and total contact casting.     Objective:  Physical Exam: warm, good capillary refill, no trophic changes or ulcerative lesions, normal DP and PT pulses and absent protective sensation to monofilament on the distal pulps of toes and plantar ball of foot, this is intact approximately the level of the ankle. Left Foot: There is warmth and edema around the dorsal midfoot compared to the right, pes planus is noted with collapsed medial arch, no ulceration or preulcerative callus noted currently no immediately palpable bony prominences.  Overall edema has improved since I last saw her.  Radiographs: X-ray of left foot: 3 weightbearing views of the left foot, midfoot Charcot with similar position alignment since last visit.  No further fragmentation, no coalescence noted. Assessment:   1. Charcot's joint arthropathy in type 1 diabetes mellitus ([place])      Plan:  Patient was evaluated and treated and all questions answered.  -Today she agreed that she will try a total contact cast.  I think this will be the most reasonable way to immobilize and support her deformity and allow her for coalescence of the midfoot Charcot.  I still advised her that she should rest as much as possible.  A well-padded below the knee total contact cast was applied with fiberglass and placed into a postop cast shoe.  She will return in 2 weeks with new x-rays, sooner if the cast is bothersome or she notes any issues.  Return in about 2 weeks (around 05/07/2020) for cast removal, new x-ray.

## 2020-05-07 ENCOUNTER — Ambulatory Visit: Payer: BC Managed Care – PPO | Admitting: Podiatry

## 2020-06-16 ENCOUNTER — Other Ambulatory Visit: Payer: Self-pay

## 2020-06-16 ENCOUNTER — Ambulatory Visit (INDEPENDENT_AMBULATORY_CARE_PROVIDER_SITE_OTHER): Payer: BC Managed Care – PPO

## 2020-06-16 ENCOUNTER — Ambulatory Visit: Payer: BC Managed Care – PPO | Admitting: Podiatry

## 2020-06-16 ENCOUNTER — Encounter: Payer: Self-pay | Admitting: Podiatry

## 2020-06-16 DIAGNOSIS — R2242 Localized swelling, mass and lump, left lower limb: Secondary | ICD-10-CM | POA: Diagnosis not present

## 2020-06-16 DIAGNOSIS — E1065 Type 1 diabetes mellitus with hyperglycemia: Secondary | ICD-10-CM

## 2020-06-16 DIAGNOSIS — E10610 Type 1 diabetes mellitus with diabetic neuropathic arthropathy: Secondary | ICD-10-CM

## 2020-06-16 DIAGNOSIS — E1042 Type 1 diabetes mellitus with diabetic polyneuropathy: Secondary | ICD-10-CM | POA: Diagnosis not present

## 2020-06-18 NOTE — Progress Notes (Signed)
  Subjective:  Patient ID: Sonya Mason, female    DOB: [birth year],  MRN: [id number]  Chief Complaint  Patient presents with  . Foot Pain    "it hurts all the time"    45 y.o. female returns for evaluation of Charcot arthropathy of the left foot and total contact casting.  She previously cut her old cast off. She presents today WBAT in a CAM boot.   Objective:  Physical Exam: warm, good capillary refill, no trophic changes or ulcerative lesions, normal DP and PT pulses and absent protective sensation to monofilament on the distal pulps of toes and plantar ball of foot, this is intact approximately the level of the ankle. Left Foot: There is warmth and edema around the dorsal midfoot compared to the right, pes planus is noted with collapsed medial arch, no ulceration or preulcerative callus noted currently no immediately palpable bony prominences.  No impending skin breakdown.  Radiographs: X-ray of left foot: 3 weightbearing views of the left foot, midfoot Charcot with similar position alignment since last visit.  No further fragmentation, no coalescence noted. Similar appearance to prior radiographs Assessment:   1. Charcot's joint arthropathy in type 1 diabetes mellitus ([place])      Plan:  Patient was evaluated and treated and all questions answered.  -We again discussed that TCC will be the most reasonable way to immobilize and support her deformity and allow her for coalescence of the midfoot Charcot. Discussed alternatives, progression of Charcot, and amputation risk.  I again advised her that she should rest as much as possible.  A well-padded below the knee total contact cast was applied with fiberglass and placed into a postop cast shoe.  She will return in 1 week, sooner if the cast is bothersome or she notes any issues.  No follow-ups on file.

## 2020-06-25 ENCOUNTER — Ambulatory Visit: Payer: BC Managed Care – PPO | Admitting: Podiatry

## 2020-08-07 DIAGNOSIS — I10 Essential (primary) hypertension: Secondary | ICD-10-CM | POA: Insufficient documentation

## 2020-08-07 DIAGNOSIS — E119 Type 2 diabetes mellitus without complications: Secondary | ICD-10-CM | POA: Insufficient documentation

## 2020-08-07 DIAGNOSIS — N920 Excessive and frequent menstruation with regular cycle: Secondary | ICD-10-CM | POA: Insufficient documentation

## 2020-08-07 DIAGNOSIS — N946 Dysmenorrhea, unspecified: Secondary | ICD-10-CM | POA: Insufficient documentation

## 2020-08-14 DIAGNOSIS — Z01419 Encounter for gynecological examination (general) (routine) without abnormal findings: Secondary | ICD-10-CM | POA: Diagnosis not present

## 2020-08-14 DIAGNOSIS — Z01411 Encounter for gynecological examination (general) (routine) with abnormal findings: Secondary | ICD-10-CM | POA: Diagnosis not present

## 2020-08-14 DIAGNOSIS — Z124 Encounter for screening for malignant neoplasm of cervix: Secondary | ICD-10-CM | POA: Diagnosis not present

## 2020-08-14 DIAGNOSIS — Z1231 Encounter for screening mammogram for malignant neoplasm of breast: Secondary | ICD-10-CM | POA: Diagnosis not present

## 2020-09-24 DIAGNOSIS — Z3200 Encounter for pregnancy test, result unknown: Secondary | ICD-10-CM | POA: Diagnosis not present

## 2020-09-24 DIAGNOSIS — N926 Irregular menstruation, unspecified: Secondary | ICD-10-CM | POA: Diagnosis not present

## 2020-12-23 ENCOUNTER — Ambulatory Visit: Payer: BC Managed Care – PPO | Admitting: Podiatry

## 2021-03-09 ENCOUNTER — Ambulatory Visit (INDEPENDENT_AMBULATORY_CARE_PROVIDER_SITE_OTHER): Payer: Commercial Managed Care - PPO

## 2021-03-09 ENCOUNTER — Other Ambulatory Visit: Payer: Self-pay

## 2021-03-09 ENCOUNTER — Ambulatory Visit: Payer: BC Managed Care – PPO | Admitting: Podiatry

## 2021-03-09 ENCOUNTER — Encounter: Payer: Self-pay | Admitting: Podiatry

## 2021-03-09 DIAGNOSIS — M19072 Primary osteoarthritis, left ankle and foot: Secondary | ICD-10-CM

## 2021-03-09 DIAGNOSIS — E10610 Type 1 diabetes mellitus with diabetic neuropathic arthropathy: Secondary | ICD-10-CM

## 2021-03-09 DIAGNOSIS — E1065 Type 1 diabetes mellitus with hyperglycemia: Secondary | ICD-10-CM

## 2021-03-09 DIAGNOSIS — Z9641 Presence of insulin pump (external) (internal): Secondary | ICD-10-CM | POA: Insufficient documentation

## 2021-03-09 DIAGNOSIS — E669 Obesity, unspecified: Secondary | ICD-10-CM | POA: Insufficient documentation

## 2021-03-09 DIAGNOSIS — R2242 Localized swelling, mass and lump, left lower limb: Secondary | ICD-10-CM

## 2021-03-09 DIAGNOSIS — E049 Nontoxic goiter, unspecified: Secondary | ICD-10-CM | POA: Insufficient documentation

## 2021-03-09 DIAGNOSIS — E109 Type 1 diabetes mellitus without complications: Secondary | ICD-10-CM | POA: Insufficient documentation

## 2021-03-11 NOTE — Progress Notes (Signed)
Subjective: 45 year old female presents the office for follow-up evaluation of left foot pain, Charcot.  She states that she was having pain when she made the appointment that has improved.  No recent injury or trauma that she reports.  She states that she cannot do the cast since she does not want any surgery she should avoid it.  She is switched to wearing shoes that have more cushion which has been helpful.  No recent injury or trauma or any other changes.   Objective: AAO x3, NAD DP/PT pulses palpable bilaterally, CRT less than 3 seconds There is still some mild chronic swelling present to the left foot and there is no erythema there is no increase in warmth better control extremity.  Majority discomfort is on the dorsal medial aspect of the midfoot area.  There is no specific area of pinpoint tenderness.  The tenderness today is minimal. No pain with calf compression, swelling, warmth, erythema  Assessment: Arthritic changes midfoot/Charcot  Plan: -All treatment options discussed with the patient including all alternatives, risks, complications.  -X-rays obtained and reviewed of the left foot.  Arthritic changes present to the midfoot consistent with Charcot.  No significant collapse noted.  There is periarticular lucencies noted. -We had a long discussion regards to options.  We discussed bracing but she wants to hold off on this.  She would not do another cast.  Unfortunate she is very active which is also difficult.  I dispensed a graphite insert to see if this will help inside of her shoe although this is not our recommendation but we can start with this.  Discussed with her the etiology and progressive nature of Charcot and should she see any increase in warmth, redness increased swelling or further issues this could lead to further deformity, ulcerations.  Trula Slade DPM

## 2021-05-11 ENCOUNTER — Ambulatory Visit: Payer: 59 | Admitting: Podiatry

## 2021-07-27 ENCOUNTER — Ambulatory Visit (INDEPENDENT_AMBULATORY_CARE_PROVIDER_SITE_OTHER): Payer: 59 | Admitting: Podiatry

## 2021-07-27 ENCOUNTER — Ambulatory Visit (INDEPENDENT_AMBULATORY_CARE_PROVIDER_SITE_OTHER): Payer: 59

## 2021-07-27 ENCOUNTER — Other Ambulatory Visit: Payer: Self-pay

## 2021-07-27 DIAGNOSIS — M14672 Charcot's joint, left ankle and foot: Secondary | ICD-10-CM

## 2021-07-27 DIAGNOSIS — M722 Plantar fascial fibromatosis: Secondary | ICD-10-CM | POA: Diagnosis not present

## 2021-07-27 DIAGNOSIS — M19072 Primary osteoarthritis, left ankle and foot: Secondary | ICD-10-CM

## 2021-07-27 MED ORDER — MELOXICAM 7.5 MG PO TABS: 7.5000 mg | ORAL_TABLET | Freq: Every day | ORAL | 0 refills | Status: AC | PRN

## 2021-07-27 NOTE — Progress Notes (Signed)
Subjective: 46 year old female presents the office for follow-up evaluation of left foot pain.  She states that recently she started noticing discomfort more to the lateral aspect of the bottom of her heel.  This started about 2 weeks ago and describes a throbbing sensation.  She denies any recent injury.  She has been wearing rain boots tomorrow without any support.  No increase in swelling or warmth of the foot that she has noted.  No recent treatment otherwise.  No other concerns.  Objective: AAO x3, NAD DP/PT pulses palpable bilaterally, CRT less than 3 seconds There is still mild swelling present to left foot but there is no significant erythema or increased temperature gradient.  There is no discomfort the dorsal aspect of the midfoot as previously noted.  The majority discomfort to the plantar lateral aspect the heel.  There is no pain with lateral compression of calcaneus.  There is no pain with Achilles tendon.  MMT 5/5. No pain with calf compression, swelling, warmth, erythema  Assessment: Arthritic changes midfoot/Charcot  Plan: -All treatment options discussed with the patient including all alternatives, risks, complications.  -X-rays obtained and reviewed.  Arthritic, Charcot changes present in the midfoot but no evidence of acute fracture. -This seems more for symptom some more along the plantar aspect of the heel, Planter fasciitis.  We discussed changing back to shoes with good arch support, stretching, icing daily.  Anti-inflammatories as needed.  If no improvement refer to physical therapy.  Trula Slade DPM

## 2021-07-27 NOTE — Patient Instructions (Signed)

## 2021-08-21 DIAGNOSIS — Z9852 Vasectomy status: Secondary | ICD-10-CM | POA: Insufficient documentation

## 2021-10-19 ENCOUNTER — Ambulatory Visit: Payer: 59 | Admitting: Podiatry

## 2021-10-19 ENCOUNTER — Encounter: Payer: Self-pay | Admitting: Podiatry

## 2021-10-19 ENCOUNTER — Ambulatory Visit (INDEPENDENT_AMBULATORY_CARE_PROVIDER_SITE_OTHER): Payer: 59

## 2021-10-19 DIAGNOSIS — S90122A Contusion of left lesser toe(s) without damage to nail, initial encounter: Secondary | ICD-10-CM

## 2021-10-19 DIAGNOSIS — M19072 Primary osteoarthritis, left ankle and foot: Secondary | ICD-10-CM

## 2021-10-19 DIAGNOSIS — S90422A Blister (nonthermal), left great toe, initial encounter: Secondary | ICD-10-CM | POA: Diagnosis not present

## 2021-10-19 DIAGNOSIS — M14672 Charcot's joint, left ankle and foot: Secondary | ICD-10-CM | POA: Diagnosis not present

## 2021-10-19 MED ORDER — DOXYCYCLINE HYCLATE 100 MG PO TABS: 100.0000 mg | ORAL_TABLET | Freq: Two times a day (BID) | ORAL | 0 refills | Status: DC

## 2021-10-21 NOTE — Progress Notes (Signed)
Subjective: ?46 year old female presents the office today for an acute appointment for an injury to her left foot that occurred over the weekend.  She says on Friday a horse stepped on her foot and she did not notice any significant bruising or pain immediately but later on after being on her feet and she took her shoe and sock off she noticed a blister as well as bruising to her big toe.  She was out of town at the time and she was seen at an urgent care where x-rays were obtained and they did not see a fracture.  The blister got the become larger yesterday and her husband did drain it with a needle.  Denies any pus.  She feels that the bruising that the happened over the weekend is somewhat improved today. ? ?Objective: ?AAO x3, NAD ?DP/PT pulses palpable bilaterally, CRT less than 3 seconds ?Sensation decreased with Semmes Weinstein monofilament ?Bruising present mostly to the hallux as well as submetatarsal area.  There is a blister present on the dorsal aspect the hallux and I did drain this today and bloody drainage was expressed but there is no purulence.  Tenderness palpation to the hallux. ?No pain with calf compression, swelling, warmth, erythema ? ? ? ? ? ? ?Assessment: ?Left foot injury, concern for fracture ? ?Plan: ?-All treatment options discussed with the patient including all alternatives, risks, complications.  ?-X-rays were obtained reviewed.  3 views were obtained.  There is a questionable radiolucency on the phalanx of the hallux concerning for fracture.  Significant arthritic changes present the midfoot consistent with likely Charcot. ?-I cleaned the skin with alcohol and I did puncture the blister with a sterile needle.  Betadine was applied followed by dressing.  Continue with daily dressing changes. ?-Surgical shoe for immobilization ?-Doxycycline ?-Monitor for any clinical signs or symptoms of infection and directed to call the office immediately should any occur or go to the ER. ?-Patient  encouraged to call the office with any questions, concerns, change in symptoms.  ? ?Trula Slade DPM ? ?

## 2021-11-03 ENCOUNTER — Ambulatory Visit: Payer: 59 | Admitting: Podiatry

## 2021-11-03 DIAGNOSIS — S90422D Blister (nonthermal), left great toe, subsequent encounter: Secondary | ICD-10-CM | POA: Diagnosis not present

## 2021-11-03 DIAGNOSIS — S90112D Contusion of left great toe without damage to nail, subsequent encounter: Secondary | ICD-10-CM

## 2021-11-04 NOTE — Progress Notes (Signed)
Subjective: 46 year old female presents the office today for follow-up evaluation after sustaining injury to her left foot.  She presents today wearing hey dude shoes.  She states that she is doing better but she still gets some swelling to the toe but overall the bruising and the blister has much improved.  No open lesions that she reports.  Denies any fevers or chills.  No pain although she does have neuropathy.  Objective: AAO x3, NAD DP/PT pulses palpable bilaterally, CRT less than 3 seconds Sensation decreased with Semmes Weinstein monofilament There is still some edema present to the hallux and there is dry skin from where the blister has dried up and started to come off.  There is no definitive skin breakdown.  There is mild erythema likely more from inflammation.  Is no drainage or pus.  No increased temperature gradient.  No open lesions noted otherwise. No pain with calf compression, swelling, warmth, erythema  Assessment: Left foot injury, concern for fracture-improving  Plan: -All treatment options discussed with the patient including all alternatives, risks, complications.  -I would recommend continue with mobilization.  She is wearing regular shoes.  Dispensed a graphite insert.  She had this previously and has done well with it.  Some of the skin is peeling which is likely from the residual swelling as well as the blister.  Monitor any skin breakdown or signs or symptoms of infection. -Glucose control.  Return in about 3 weeks (around 11/24/2021) for toe injury, x-ray.  Trula Slade DPM

## 2021-11-24 ENCOUNTER — Ambulatory Visit: Payer: 59 | Admitting: Podiatry

## 2022-04-05 ENCOUNTER — Ambulatory Visit: Payer: 59 | Admitting: Podiatry

## 2022-04-05 DIAGNOSIS — S90221A Contusion of right lesser toe(s) with damage to nail, initial encounter: Secondary | ICD-10-CM | POA: Diagnosis not present

## 2022-04-05 DIAGNOSIS — E1065 Type 1 diabetes mellitus with hyperglycemia: Secondary | ICD-10-CM

## 2022-04-05 DIAGNOSIS — M14672 Charcot's joint, left ankle and foot: Secondary | ICD-10-CM

## 2022-04-05 DIAGNOSIS — L03031 Cellulitis of right toe: Secondary | ICD-10-CM | POA: Diagnosis not present

## 2022-04-05 DIAGNOSIS — L97511 Non-pressure chronic ulcer of other part of right foot limited to breakdown of skin: Secondary | ICD-10-CM | POA: Diagnosis not present

## 2022-04-05 MED ORDER — SULFAMETHOXAZOLE-TRIMETHOPRIM 800-160 MG PO TABS: 1.0000 | ORAL_TABLET | Freq: Two times a day (BID) | ORAL | 0 refills | Status: DC

## 2022-04-05 MED ORDER — MUPIROCIN 2 % EX OINT: 1.0000 | TOPICAL_OINTMENT | Freq: Two times a day (BID) | CUTANEOUS | 2 refills | Status: AC

## 2022-04-05 NOTE — Progress Notes (Unsigned)
Subjective: Chief Complaint  Patient presents with   Diabetic Ulcer    Right foot 5th blister, started Thursday,sore, rate of pain 9 out of 10 when wearing a shoe, Diabetic   46 year old female presents for an acute appointment with the above concerns.  She states that on the right fifth toe she noticed a blister on Thursday and since its been sore.  Hurts to wear shoe.  No current drainage or pus.  It is swollen or red on the toe however.  Also states that she has been getting more numbness in her left foot.  She does get back pain which may have been worsening some.  No radiating pain.  Objective: AAO x3, NAD DP/PT pulses palpable bilaterally, CRT less than 3 seconds On the right second digit nail this amount of dried blood at the base of the nail.  Clinically appear to be dried blood.  No edema, erythema or signs of infection.  See picture below.  On the right fifth toe deroofed blister present on the fifth toe with localized edema and erythema noted but there is no drainage or pus or ascending cellulitis.  No fluctuance or crepitation but there is no malodor. No pain with calf compression, swelling, warmth, erythema       Assessment: Right fifth toe ulceration with localized cellulitis; supple hematoma right second toenail; neuropathy  Plan: -All treatment options discussed with the patient including all alternatives, risks, complications.  -Regards to the right foot recommend washing with soap and water daily, dry thoroughly.  Prescribed Bactrim.  Mupirocin ointment dressing changes daily.  Offloading.  She has a surgical shoe at home I like for her to wear. Monitor for any clinical signs or symptoms of infection and directed to call the office immediately should any occur or go to the ER. -Monitor the right second toe.  Clinically appears to be blood but not sure this is growing out.  If no improvement or if there is any worsening recommend biopsy. -She does have neuropathy but some  worsening numbness in her left foot.  She has been having small more back pain.  This is more low back.  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 -Patient encouraged to call the office with any questions, concerns, change in symptoms.   Trula Slade DPM

## 2022-06-18 ENCOUNTER — Ambulatory Visit: Payer: 59 | Admitting: Podiatry

## 2022-06-18 ENCOUNTER — Ambulatory Visit (INDEPENDENT_AMBULATORY_CARE_PROVIDER_SITE_OTHER): Payer: 59

## 2022-06-18 DIAGNOSIS — M79672 Pain in left foot: Secondary | ICD-10-CM

## 2022-06-18 DIAGNOSIS — T148XXA Other injury of unspecified body region, initial encounter: Secondary | ICD-10-CM

## 2022-06-18 DIAGNOSIS — M79671 Pain in right foot: Secondary | ICD-10-CM

## 2022-06-18 DIAGNOSIS — S90221S Contusion of right lesser toe(s) with damage to nail, sequela: Secondary | ICD-10-CM | POA: Diagnosis not present

## 2022-06-18 DIAGNOSIS — M14672 Charcot's joint, left ankle and foot: Secondary | ICD-10-CM | POA: Diagnosis not present

## 2022-06-18 DIAGNOSIS — M2141 Flat foot [pes planus] (acquired), right foot: Secondary | ICD-10-CM

## 2022-06-18 DIAGNOSIS — M2142 Flat foot [pes planus] (acquired), left foot: Secondary | ICD-10-CM | POA: Diagnosis not present

## 2022-06-18 NOTE — Progress Notes (Signed)
Subjective: Chief Complaint  Patient presents with   Foot Problem    right foot bruise under nail looking worse/ possible biopsy, pain located in left foot as well   47 year old female presents the office today with above concerns.  She states that she was recently in [place] and she did a lot of walking wearing boots and has traumatized her right second toenail.  This happened previously as well.  She states that the bruising is growing out.  Denies any swelling redness or any drainage.  Wound on the right fifth toe is healed.  She does state that she been having increased discomfort most of the bottom of her left foot over the last couple months.  She gets swelling.  She is not able to wear the compression advised to cause pain but she thinks may be a higher compression sock would do better for her.  No injuries that she reports.  No redness.  No open lesions.  Objective: AAO x3, NAD DP/PT pulses palpable bilaterally, CRT less than 3 seconds The right second digit nail is hypertrophic and dystrophic and there is dried blood under the toenail.  Appears that the base of the nail is clear and looks improved but is growing out.  There is no extension of hyperpigmentation of the surrounding skin.  There is no edema, erythema, drainage or pus.  There is flattening of the arch in the left foot mild prominence on the plantar midfoot on the left side.  She can discomfort when on the plantar medial arch of the left foot.  There is no areas of point tenderness.  There is some edema present on the dorsal aspect of the foot. No pain with calf compression, swelling, warmth, erythema  Assessment: Right second toe onychodystrophy/subungual hematoma; left foot Charcot  Plan: -All treatment options discussed with the patient including all alternatives, risks, complications.  -Strays obtained and reviewed bilaterally.  Right foot there is some arthritic changes present at the midfoot.  Hardware intact, first  metatarsal, prior surgery.  Charcot, arthritic changes present on the midfoot on the left side.  There is a consolidating.  Dorsal spurring present. -Regards the right second toe I sharply debrided the nail without any complications.  Monitoring signs or signs of infection.  Toe cap dispensed -On the left side dispensed graphite insert.  She is only able to wear he do not shoes given the swelling.  Discussed knee-high compression socks.  Has not purchased over-the-counter.  He provide of prescription.  Discussed exercises.  Monitor for any reoccurrence of Charcot I think at this time her pain is coming more in the arthritis, dorsal spurring. Graphite insert -Patient encouraged to call the office with any questions, concerns, change in symptoms.   Trula Slade DPM

## 2022-06-18 NOTE — Patient Instructions (Signed)

## 2022-07-06 ENCOUNTER — Ambulatory Visit: Payer: 59 | Admitting: Podiatry

## 2022-07-20 ENCOUNTER — Other Ambulatory Visit: Payer: Self-pay | Admitting: Endocrinology

## 2022-07-20 DIAGNOSIS — E049 Nontoxic goiter, unspecified: Secondary | ICD-10-CM

## 2022-07-28 ENCOUNTER — Other Ambulatory Visit: Payer: BC Managed Care – PPO

## 2022-08-12 ENCOUNTER — Ambulatory Visit
Admission: RE | Admit: 2022-08-12 | Discharge: 2022-08-12 | Disposition: A | Discharge status: Home / Self Care | Payer: 59 | Source: Ambulatory Visit | Attending: Endocrinology | Admitting: Endocrinology

## 2022-08-12 DIAGNOSIS — E049 Nontoxic goiter, unspecified: Secondary | ICD-10-CM

## 2022-10-06 LAB — COLOGUARD: COLOGUARD: NEGATIVE

## 2022-12-13 ENCOUNTER — Ambulatory Visit (INDEPENDENT_AMBULATORY_CARE_PROVIDER_SITE_OTHER): Payer: 59

## 2022-12-13 ENCOUNTER — Encounter: Payer: Self-pay | Admitting: Podiatry

## 2022-12-13 ENCOUNTER — Ambulatory Visit: Payer: 59 | Admitting: Podiatry

## 2022-12-13 DIAGNOSIS — S90221A Contusion of right lesser toe(s) with damage to nail, initial encounter: Secondary | ICD-10-CM | POA: Diagnosis not present

## 2022-12-13 DIAGNOSIS — S92511A Displaced fracture of proximal phalanx of right lesser toe(s), initial encounter for closed fracture: Secondary | ICD-10-CM

## 2022-12-18 NOTE — Progress Notes (Signed)
Subjective: Chief Complaint  Patient presents with   Toe Injury    2nd/3rd toe right - jammed toe over the weekend, bruised 3rd toe around the nail and tenderness in 2nd, went to [facility]-xrayed-fractured 2nd, splinted and wearing open toe surgical shoe   47 year old female presents for above concerns, which are new.  Over the weekend she states that she jammed her toe which some bruising to her third toe and tenderness on her second toe.  She did go to [facility] over the weekend and x-rays performed and which revealed a fracture of the second toe.  She presents today wearing a surgical shoe.  No other injuries at this time.  Objective: AAO x3, NAD DP/PT pulses palpable bilaterally, CRT less than 3 seconds There is bruising present distal aspect of the right third toe just proximal to the nail.  She is majority of tenderness.  There is mild tenderness palpation of the second toe.  Clinically the toes are rectus.  There is no pain in the metatarsals or other.  The foot, ankle. No pain with calf compression, swelling, warmth, erythema  Assessment: 47 year old female with right second toe fracture displacement  Plan: -All treatment options discussed with the patient including all alternatives, risks, complications.  -X-rays were obtained and reviewed.  Displaced fracture noted along the proximal phalanx head of the second digit. -We discussed the conservative as well as surgical treatment option.  Discussed pinning of the digit given the displacement of the fracture.  Given the orientation of this I think it may be difficult and.  We discussed surgery now versus what it is healed and should this become symptomatic later during PIPJ arthrodesis.  After discussion he initially agreed to continue this conservatively for now and would become symptomatic later and likely performing fusion of the toe.  Given the displacement she will likely develop arthritis in the joint anyway. -Continue  buddy splinting as well as surgical shoe.  Ice, elevation. -Patient encouraged to call the office with any questions, concerns, change in symptoms.   Vivi Barrack DPM

## 2023-01-03 ENCOUNTER — Ambulatory Visit: Payer: 59 | Admitting: Podiatry

## 2023-01-03 ENCOUNTER — Ambulatory Visit (INDEPENDENT_AMBULATORY_CARE_PROVIDER_SITE_OTHER): Payer: 59

## 2023-01-03 DIAGNOSIS — S92511D Displaced fracture of proximal phalanx of right lesser toe(s), subsequent encounter for fracture with routine healing: Secondary | ICD-10-CM

## 2023-01-03 DIAGNOSIS — S92511A Displaced fracture of proximal phalanx of right lesser toe(s), initial encounter for closed fracture: Secondary | ICD-10-CM

## 2023-01-03 NOTE — Progress Notes (Unsigned)
Sx Spt 4

## 2023-01-06 ENCOUNTER — Encounter: Payer: Self-pay | Admitting: Podiatry

## 2023-01-31 ENCOUNTER — Telehealth: Payer: Self-pay | Admitting: Urology

## 2023-01-31 NOTE — Telephone Encounter (Signed)
DOS - 02/09/23  HAMMERTOE REPAIR 2ND LEFT --- 06237  [facility] EFFECTIVE DATE - 06/07/22  DEDUCTIBLE - $4,000.00 W/ $0.00 REMAINING OOP - $8,150.00 W/ $6,283.15 REMAINING COINSURANCE - 20%  PER [employer] WEBSITE FOR CPT CODE 17616 HAS BEEN APPROVED, AUTH # [id number], 02/09/23 - 05/10/23.

## 2023-02-08 ENCOUNTER — Ambulatory Visit (INDEPENDENT_AMBULATORY_CARE_PROVIDER_SITE_OTHER): Payer: 59

## 2023-02-08 ENCOUNTER — Ambulatory Visit: Payer: 59 | Admitting: Podiatry

## 2023-02-08 DIAGNOSIS — S92511D Displaced fracture of proximal phalanx of right lesser toe(s), subsequent encounter for fracture with routine healing: Secondary | ICD-10-CM

## 2023-02-08 MED ORDER — HYDROCODONE-ACETAMINOPHEN 5-325 MG PO TABS: 1.0000 | ORAL_TABLET | Freq: Four times a day (QID) | ORAL | 0 refills | Status: AC | PRN

## 2023-02-08 MED ORDER — PROMETHAZINE HCL 25 MG PO TABS: 25.0000 mg | ORAL_TABLET | Freq: Three times a day (TID) | ORAL | 0 refills | Status: AC | PRN

## 2023-02-08 MED ORDER — CLINDAMYCIN HCL 300 MG PO CAPS: 300.0000 mg | ORAL_CAPSULE | Freq: Three times a day (TID) | ORAL | 0 refills | Status: DC

## 2023-02-08 NOTE — Progress Notes (Signed)
Subjective: No chief complaint on file.  47 year old female presents the office as above concerns.  She presents today for evaluation of pain to her right second toe status post injury, fracture.  She is scheduled for surgery tomorrow as the fracture has moved and she has noticed a knot on the side of her toe.  This does cause pain.  No open lesions or calluses to this area that she reports.  Objective: AAO x3, NAD DP/PT pulses palpable bilaterally, CRT less than 3 seconds Sensation decreased. Decreased range of motion of the PIPJ of the right second toe local edema to this area there is no erythema or warmth.  Prominence of the medial, lateral aspect of the joint as well. No pain with calf compression, swelling, warmth, erythema  Assessment: Close displaced fracture right second toe  Plan: -All treatment options discussed with the patient including all alternatives, risks, complications.  -We discussed surgery in the last couple occasions.  Initially discussed pinning of  the toe but given the small size of the fracture fragment we try to treat this conservatively knowing that if it were to heal incorrectly would need to proceed with PIPJ arthrodesis.  As the fracture has moved we discussed going to proceed with PIPJ arthrodesis.  She is scheduled for surgery tomorrow and she would like to proceed with this. -The incision placement as well as the postoperative course was discussed with the patient. I discussed risks of the surgery which include, but not limited to, infection, bleeding, pain, swelling, need for further surgery, delayed or nonhealing, painful or ugly scar, numbness or sensation changes, over/under correction, recurrence, transfer lesions, further deformity, hardware failure, DVT/PE, loss of toe/foot. Patient understands these risks and wishes to proceed with surgery. The surgical consent was reviewed with the patient all 3 pages were signed. No promises or guarantees were given to  the outcome of the procedure. All questions were answered to the best of my ability. Before the surgery the patient was encouraged to call the office if there is any further questions. The surgery will be performed at the [facility] on an outpatient basis. -Patient encouraged to call the office with any questions, concerns, change in symptoms.   Vivi Barrack DPM

## 2023-02-09 DIAGNOSIS — S92511D Displaced fracture of proximal phalanx of right lesser toe(s), subsequent encounter for fracture with routine healing: Secondary | ICD-10-CM | POA: Diagnosis not present

## 2023-02-14 ENCOUNTER — Ambulatory Visit (INDEPENDENT_AMBULATORY_CARE_PROVIDER_SITE_OTHER): Payer: 59 | Admitting: Podiatry

## 2023-02-14 ENCOUNTER — Ambulatory Visit: Payer: 59

## 2023-02-14 DIAGNOSIS — Z9889 Other specified postprocedural states: Secondary | ICD-10-CM

## 2023-02-14 DIAGNOSIS — S92511D Displaced fracture of proximal phalanx of right lesser toe(s), subsequent encounter for fracture with routine healing: Secondary | ICD-10-CM

## 2023-02-20 NOTE — Progress Notes (Signed)
Subjective: Chief Complaint  Patient presents with   Routine Post Op    POV # 1 DOS 02/09/2023 --- HAMMERTOE REPAIR 2ND RT Pt stated that she is doing okay no fever,chills,nausea or vomiting pain is very mild   47 year old female presents the office with above concerns.  States that she is doing well.  No significant pain.  She does not endorse any fevers or chills.  She has no other concerns today.   Objective: AAO x3, NAD DP/PT pulses palpable bilaterally, CRT less than 3 seconds All the right second toe incisions well coapted with sutures intact.  There is edema present but there is no erythema, warmth or any drainage or pus or any signs of infection noted today.  There are no open lesions.  Some mild discomfort on the surgical sites but no other areas of discomfort identified at this time.  He is No pain with calf compression, swelling, warmth, erythema  Assessment: 47 year old female status post right second digit repair  Plan: -All treatment options discussed with the patient including all alternatives, risks, complications.  -X-rays obtained and reviewed.  3 views of the foot were obtained.  Status post hardware transversing the second digit from prior surgery.  Hardware in appropriate position without any breakage or any complications at this time. -Incisions healing well.  Small amount of antibiotic ointment was applied followed by dressing.  Keep the dressing clean, dry, intact. -Remain in surgical shoe Monitor for any clinical signs or symptoms of infection and directed to call the office immediately should any occur or go to the ER. -Patient encouraged to call the office with any questions, concerns, change in symptoms.   Vivi Barrack DPM

## 2023-02-21 ENCOUNTER — Ambulatory Visit (INDEPENDENT_AMBULATORY_CARE_PROVIDER_SITE_OTHER): Payer: 59

## 2023-02-21 ENCOUNTER — Ambulatory Visit (INDEPENDENT_AMBULATORY_CARE_PROVIDER_SITE_OTHER): Payer: 59 | Admitting: Podiatry

## 2023-02-21 DIAGNOSIS — S92511D Displaced fracture of proximal phalanx of right lesser toe(s), subsequent encounter for fracture with routine healing: Secondary | ICD-10-CM

## 2023-02-22 ENCOUNTER — Encounter: Payer: 59 | Admitting: Podiatry

## 2023-02-22 NOTE — Progress Notes (Signed)
Subjective: Chief Complaint  Patient presents with   Routine Post Op    POV #1 DOS 02/09/2023 --- HAMMERTOE REPAIR 2ND RT     47 year old female presents the office with above concerns.  She presents today for possible suture removal.  States that she is doing well.  No significant pain.  No fevers or chills.  She has no other concerns today.   Objective: AAO x3, NAD DP/PT pulses palpable bilaterally, CRT less than 3 seconds All the right second toe incisions well coapted with sutures intact.  There is still some motion across the incision.  Mild edema still present but appears to be improving.  No surrounding erythema, ascending cellulitis.  There is no drainage or pus or signs of infection. No pain with calf compression, swelling, warmth, erythema  Assessment: 47 year old female status post right second digit repair  Plan: -All treatment options discussed with the patient including all alternatives, risks, complications.  -Still motion across the incisions follow-up with sutures intact today.  Antibiotic ointment and dressing applied.  Keep the dressing clean, dry, intact for now.  If she desires she can wash with soap and water, dry thoroughly and apply a similar bandage over the next week.  Remain in surgical shoe, elevation. -Monitor for any clinical signs or symptoms of infection and directed to call the office immediately should any occur or go to the ER.  Suture removal next week and I will see her back in 2 weeks for follow-up.  Remain in surgical shoe.  Vivi Barrack DPM

## 2023-02-24 ENCOUNTER — Encounter: Payer: 59 | Admitting: Podiatry

## 2023-02-25 ENCOUNTER — Telehealth: Payer: Self-pay | Admitting: Podiatry

## 2023-02-25 MED ORDER — CLINDAMYCIN HCL 300 MG PO CAPS: 300.0000 mg | ORAL_CAPSULE | Freq: Three times a day (TID) | ORAL | 0 refills | Status: AC

## 2023-02-25 NOTE — Telephone Encounter (Signed)
Pt had surgery on 9/4 and had more pain in toe that he put the screw and the whole leg has throbbing she had to take pain medication. Is this normal or ok? Still has stitches in as they were not ready at last appt. Pt is diabetic.Had chills last night. But not since then.

## 2023-02-25 NOTE — Telephone Encounter (Signed)
Spoke with patient she had brief chills last night, her temperature was over 98 today it usually is around 96 if she feels like it is a little bit high.  I placed her back on clindamycin for 7 days discussed possible side effects and GI issues that could arise to let me know if these develop.  The Tenderness is not happening anymore she said it was brief and is gone, discussed if this returns or worsens to proceed to ER for ultrasound evaluation to rule out DVT but suspect this is not likely at this point since it is no longer hurting.  She denies that the calf is red or swollen as well so unlikely cellulitis but will treat empirically to ensure she does not develop postop infection

## 2023-03-03 ENCOUNTER — Encounter: Payer: Self-pay | Admitting: Podiatry

## 2023-03-03 ENCOUNTER — Ambulatory Visit (INDEPENDENT_AMBULATORY_CARE_PROVIDER_SITE_OTHER): Payer: 59

## 2023-03-03 ENCOUNTER — Ambulatory Visit: Payer: 59 | Admitting: Podiatry

## 2023-03-03 DIAGNOSIS — Z9889 Other specified postprocedural states: Secondary | ICD-10-CM

## 2023-03-03 DIAGNOSIS — S92511D Displaced fracture of proximal phalanx of right lesser toe(s), subsequent encounter for fracture with routine healing: Secondary | ICD-10-CM

## 2023-03-03 NOTE — Progress Notes (Signed)
Subjective: Chief Complaint  Patient presents with   Routine Post Op    post op visit suture removal HAMMERTOE REPAIR 2ND RT     47 year old female presents the office with above concerns.  Date of surgery 02/09/2023, status post right 2nd PIPJ arthrodesis secondary to closed fracture of proximal phalanx. She presents today for possible suture removal.  States that she is doing well.  No significant pain.  No fevers or chills.  She has no other concerns today.   Objective: AAO x3, NAD DP/PT pulses palpable bilaterally, CRT less than 3 seconds All the right second toe incisions well coapted with sutures intact.  No Evidence of dehiscence or gapping.  Mild edema still present but appears to be improving.  No surrounding erythema, ascending cellulitis.  There is no drainage or pus or signs of infection. No pain with calf compression, swelling, warmth, erythema  Radiographs: Right foot 03/03/23 Right foot radiographs were obtained.  Fixation appreciated to the right second digit.  Position of the arthrodesis site is maintained.  No signs of hardware loosening appreciated.  Assessment: 47 year old female status post right second digit repair  Plan: -All treatment options discussed with the patient including all alternatives, risks, complications.  -Sutures were removed today without incident. Dry bandage applied.  If she desires she can wash with soap and water, dry thoroughly and apply a similar bandage over the next week.  Remain in surgical shoe, elevation. -Monitor for any clinical signs or symptoms of infection and directed to call the office immediately should any occur or go to the ER.  Continue in surgical shoe for another 3-4 weeks. Follow up with Dr. Ardelle Anton at this time.  Barbaraann Share DPM

## 2023-03-10 ENCOUNTER — Encounter: Payer: 59 | Admitting: Podiatry

## 2023-03-14 ENCOUNTER — Ambulatory Visit (INDEPENDENT_AMBULATORY_CARE_PROVIDER_SITE_OTHER): Payer: 59 | Admitting: Podiatry

## 2023-03-14 ENCOUNTER — Ambulatory Visit: Payer: 59

## 2023-03-14 DIAGNOSIS — S92511D Displaced fracture of proximal phalanx of right lesser toe(s), subsequent encounter for fracture with routine healing: Secondary | ICD-10-CM

## 2023-03-14 NOTE — Progress Notes (Signed)
Subjective: No chief complaint on file.    47 year old female presents the office for follow-up evaluation undergoing PIPJ arthrodesis given fracture.  She reports that she did have episodes of fevers and pains with her antibiotics she took most of them and all of them.  Symptoms have resolved.   She does state that she is likely on her feet too much and she is having to cut a proper brain on the tractor.  No fevers or chills currently.  Objective: AAO x3, NAD DP/PT pulses palpable bilaterally, CRT less than 3 seconds All the right second toe incisions well coapted and small Remains there is no open lesion or dehiscence.  There is still some edema present to the toe there is no erythema, increased temperature.  No drainage or pus.  No fluctuation or crepitation.  Similar.  Toe is rectus but does sit slightly dorsiflexed block with MPJ.  No pain with calf compression, swelling, warmth, erythema  Assessment: 47 year old female status post right second digit repair  Plan: -All treatment options discussed with the patient including all alternatives, risks, complications.  -X-rays obtained reviewed.  3 views of the foot were obtained.  No evidence of acute fracture.  Hardware intact. -I dispensed a single limb toe regulator to help hold the toe down.  She is back to 1 regular shoes.  Continue ice and elevate as well as compression to help with residual edema. -Monitor for any clinical signs or symptoms of infection and directed to call the office immediately should any occur or go to the ER.  Return in about 4 weeks (around 04/11/2023).  X-ray next appointment  Vivi Barrack DPM

## 2023-04-04 ENCOUNTER — Ambulatory Visit (INDEPENDENT_AMBULATORY_CARE_PROVIDER_SITE_OTHER): Payer: 59

## 2023-04-04 ENCOUNTER — Encounter: Payer: Self-pay | Admitting: Podiatry

## 2023-04-04 ENCOUNTER — Ambulatory Visit (INDEPENDENT_AMBULATORY_CARE_PROVIDER_SITE_OTHER): Payer: 59 | Admitting: Podiatry

## 2023-04-04 DIAGNOSIS — M2041 Other hammer toe(s) (acquired), right foot: Secondary | ICD-10-CM

## 2023-04-04 NOTE — Progress Notes (Signed)
Subjective: Chief Complaint  Patient presents with   Hammer Toe    PATIENT STATES EVERYTHING HAS BEEN OK , A LITTLE BIT OF PAIN NOT BAD,      47 year old female presents the office for follow-up evaluation undergoing PIPJ arthrodesis given fracture.  Overall she has been doing well.  Still gets swelling.  She does report intermittent discomfort though overall pain is improving.  She has abated the fifth toe.  She states that she was wearing a boot that was rubbing while riding her horse over the weekend.  Currently denies any fevers or chills.  Objective: AAO x3, NAD DP/PT pulses palpable bilaterally, CRT less than 3 seconds All the right second toe incisions well coapted and scar is formed.  Toe is rectus with sit slightly dorsiflexed.  There is still some edema present but there is no cellulitis or signs of infection.  No open lesions otherwise except from the right fifth toe appears to be an old blister and there is some localized erythema from irritation.  No drainage or pus.  No increased temperature.  No fluctuation or crepitation.  No malodor. No pain with calf compression, swelling, warmth, erythema  Assessment: 47 year old female status post right second digit repair  Plan: -All treatment options discussed with the patient including all alternatives, risks, complications.  -X-rays obtained reviewed.  3 views of the foot were obtained.  No evidence of acute fracture.  Hardware intact.  No evidence of acute osteomyelitis or fracture of the fifth toe. -We discussed how to tape the toe to help with swelling of the second toe.  Continue ice.  Stiffer soled shoe.  In regards to the blister discussed with the mother, dressing changes daily.  Offloading.  Monitor any signs or symptoms of infection.  Not healed next 1 to 2 weeks to me know or sooner for any worsening or any signs or symptoms of infection. -Monitor for any clinical signs or symptoms of infection and directed to call the office  immediately should any occur or go to the ER.  Return in about 6 weeks (around 05/16/2023).  X-ray at next appointment  Vivi Barrack DPM

## 2023-04-27 ENCOUNTER — Other Ambulatory Visit: Payer: Self-pay

## 2023-04-27 ENCOUNTER — Encounter (HOSPITAL_BASED_OUTPATIENT_CLINIC_OR_DEPARTMENT_OTHER): Payer: Self-pay | Admitting: Emergency Medicine

## 2023-04-27 ENCOUNTER — Emergency Department (HOSPITAL_BASED_OUTPATIENT_CLINIC_OR_DEPARTMENT_OTHER): Payer: 59 | Admitting: Radiology

## 2023-04-27 ENCOUNTER — Emergency Department (HOSPITAL_BASED_OUTPATIENT_CLINIC_OR_DEPARTMENT_OTHER)
Admission: EM | Admit: 2023-04-27 | Discharge: 2023-04-27 | Disposition: A | Discharge status: Against Medical Advice | Payer: 59 | Attending: Emergency Medicine | Admitting: Emergency Medicine

## 2023-04-27 DIAGNOSIS — R0789 Other chest pain: Secondary | ICD-10-CM | POA: Insufficient documentation

## 2023-04-27 DIAGNOSIS — Z5321 Procedure and treatment not carried out due to patient leaving prior to being seen by health care provider: Secondary | ICD-10-CM | POA: Insufficient documentation

## 2023-04-27 DIAGNOSIS — R0602 Shortness of breath: Secondary | ICD-10-CM | POA: Insufficient documentation

## 2023-04-27 LAB — BASIC METABOLIC PANEL
Anion gap: 4 — ABNORMAL LOW (ref 5–15)
BUN: 20 mg/dL (ref 6–20)
CO2: 29 mmol/L (ref 22–32)
Calcium: 9.2 mg/dL (ref 8.9–10.3)
Chloride: 101 mmol/L (ref 98–111)
Creatinine, Ser: 0.77 mg/dL (ref 0.44–1.00)
GFR, Estimated: >60 mL/min (ref >60)
Glucose, Bld: 213 mg/dL — ABNORMAL HIGH (ref 70–99)
Potassium: 4.5 mmol/L (ref 3.5–5.1)
Sodium: 134 mmol/L — ABNORMAL LOW (ref 135–145)

## 2023-04-27 LAB — CBC
HCT: 39.7 % (ref 36.0–46.0)
Hemoglobin: 13.5 g/dL (ref 12.0–15.0)
MCH: 31 pg (ref 26.0–34.0)
MCHC: 34 g/dL (ref 30.0–36.0)
MCV: 91.1 fL (ref 80.0–100.0)
Platelets: 223 10*3/uL (ref 150–400)
RBC: 4.36 MIL/uL (ref 3.87–5.11)
RDW: 11.9 % (ref 11.5–15.5)
WBC: 8.7 10*3/uL (ref 4.0–10.5)
nRBC: 0 % (ref 0.0–0.2)

## 2023-04-27 LAB — PROTIME-INR
INR: 0.9 (ref 0.8–1.2)
Prothrombin Time: 12.5 s (ref 11.4–15.2)

## 2023-04-27 LAB — TROPONIN I (HIGH SENSITIVITY): Troponin I (High Sensitivity): <2 ng/L (ref <18)

## 2023-04-27 LAB — PREGNANCY, URINE: Preg Test, Ur: NEGATIVE

## 2023-04-27 NOTE — ED Triage Notes (Signed)
Pt via pov from home with chest tightness x 5 days. States she saw endocrinologist on Monday; had cxr that was non remarkable. 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 Pt reports that pain radiates to her back. Endorses "a little" sob. Pt alert & oriented, nad noted.

## 2023-05-24 ENCOUNTER — Other Ambulatory Visit: Payer: Self-pay | Admitting: Surgery

## 2023-09-06 ENCOUNTER — Ambulatory Visit (INDEPENDENT_AMBULATORY_CARE_PROVIDER_SITE_OTHER)

## 2023-09-06 ENCOUNTER — Encounter: Payer: Self-pay | Admitting: Podiatry

## 2023-09-06 ENCOUNTER — Ambulatory Visit: Admitting: Podiatry

## 2023-09-06 DIAGNOSIS — M79671 Pain in right foot: Secondary | ICD-10-CM

## 2023-09-06 DIAGNOSIS — M7751 Other enthesopathy of right foot: Secondary | ICD-10-CM | POA: Diagnosis not present

## 2023-09-06 DIAGNOSIS — M19071 Primary osteoarthritis, right ankle and foot: Secondary | ICD-10-CM

## 2023-09-06 DIAGNOSIS — M19072 Primary osteoarthritis, left ankle and foot: Secondary | ICD-10-CM

## 2023-09-07 NOTE — Progress Notes (Signed)
 Subjective: Chief Complaint  Patient presents with   Foot Pain    RM#11 Right foot pain shooting pain started last week.   48 year old female presents the office with concerns of sharp pain to her right foot which worsened last week without any injuries.  No change in activities.  She points on the second MTPJ where she gets the discomfort.  She is not sure if is because the second toe sits up since she had the injury.  She has chronic swelling to both of her feet.  Objective: AAO x3, NAD DP/PT pulses palpable bilaterally, CRT less than 3 seconds Second toe is rectus. There is slight dorsal contracture present MPJ.  Unable to appreciate any significant area pinpoint tenderness or pain on exam but she states most of this when she walks.  On exam of the tenderness is on the second interspace.  No palpable neuroma identified at this time.  There is no open lesions. No pain with calf compression, swelling, warmth, erythema  Assessment: Capsulitis right foot; Charcot/arthritis  Plan: -All treatment options discussed with the patient including all alternatives, risks, complications.  -X-rays obtained reviewed.  Multiple views of the right foot.  Hardware intact the second toe any complicating factors.  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  Decreased arch height noted on lateral view. -Offered steroid injection but she was started up on this.  Discussed anti-inflammatories both topical and orally.  I do think she will benefit from accommodative type orthotic treatment for follow-up with Bethann Berkshire, pedorthist. -Patient encouraged to call the office with any questions, concerns, change in symptoms.   Vivi Barrack DPM

## 2023-11-01 ENCOUNTER — Other Ambulatory Visit

## 2024-02-16 ENCOUNTER — Other Ambulatory Visit (HOSPITAL_BASED_OUTPATIENT_CLINIC_OR_DEPARTMENT_OTHER): Payer: Self-pay | Admitting: Endocrinology

## 2024-02-16 DIAGNOSIS — E109 Type 1 diabetes mellitus without complications: Secondary | ICD-10-CM

## 2024-04-04 ENCOUNTER — Ambulatory Visit (HOSPITAL_COMMUNITY)
Admission: RE | Admit: 2024-04-04 | Discharge: 2024-04-04 | Disposition: A | Discharge status: Home / Self Care | Payer: Self-pay | Source: Ambulatory Visit | Attending: Endocrinology | Admitting: Endocrinology

## 2024-04-04 DIAGNOSIS — E109 Type 1 diabetes mellitus without complications: Secondary | ICD-10-CM | POA: Insufficient documentation
